# Patient Record
Sex: Female | Born: 1950 | Race: White | Hispanic: No | Marital: Married | Smoking: Former smoker
Health system: Southern US, Community
[De-identification: ages and names within clinical notes are randomized; demographics above are authoritative.]

## PROBLEM LIST (undated history)

## (undated) DIAGNOSIS — N390 Urinary tract infection, site not specified: Secondary | ICD-10-CM

## (undated) DIAGNOSIS — R519 Headache, unspecified: Secondary | ICD-10-CM

## (undated) DIAGNOSIS — R51 Headache: Secondary | ICD-10-CM

## (undated) DIAGNOSIS — Z87442 Personal history of urinary calculi: Secondary | ICD-10-CM

## (undated) DIAGNOSIS — R42 Dizziness and giddiness: Secondary | ICD-10-CM

## (undated) DIAGNOSIS — R202 Paresthesia of skin: Secondary | ICD-10-CM

## (undated) DIAGNOSIS — R200 Anesthesia of skin: Secondary | ICD-10-CM

## (undated) HISTORY — DX: Urinary tract infection, site not specified: N39.0

## (undated) HISTORY — PX: TONSILLECTOMY: SUR1361

## (undated) HISTORY — PX: CATARACT EXTRACTION, BILATERAL: SHX1313

## (undated) HISTORY — PX: CHOLECYSTECTOMY: SHX55

## (undated) HISTORY — PX: ABDOMINAL HYSTERECTOMY: SHX81

---

## 2011-10-30 ENCOUNTER — Emergency Department: Payer: Self-pay | Admitting: Emergency Medicine

## 2011-11-15 ENCOUNTER — Ambulatory Visit: Payer: Self-pay | Admitting: Surgery

## 2011-12-13 ENCOUNTER — Other Ambulatory Visit: Payer: Self-pay | Admitting: Neurology

## 2011-12-13 DIAGNOSIS — M5410 Radiculopathy, site unspecified: Secondary | ICD-10-CM

## 2011-12-17 ENCOUNTER — Ambulatory Visit (HOSPITAL_COMMUNITY)
Admission: RE | Admit: 2011-12-17 | Discharge: 2011-12-17 | Disposition: A | Discharge status: Home / Self Care | Payer: Federal, State, Local not specified - PPO | Source: Ambulatory Visit | Attending: Neurology | Admitting: Neurology

## 2011-12-17 DIAGNOSIS — M5126 Other intervertebral disc displacement, lumbar region: Secondary | ICD-10-CM | POA: Insufficient documentation

## 2011-12-17 DIAGNOSIS — M5410 Radiculopathy, site unspecified: Secondary | ICD-10-CM

## 2011-12-17 DIAGNOSIS — M545 Low back pain, unspecified: Secondary | ICD-10-CM | POA: Insufficient documentation

## 2011-12-17 DIAGNOSIS — R209 Unspecified disturbances of skin sensation: Secondary | ICD-10-CM | POA: Insufficient documentation

## 2012-08-30 ENCOUNTER — Ambulatory Visit: Payer: Self-pay | Admitting: Family Medicine

## 2012-09-25 ENCOUNTER — Ambulatory Visit: Payer: Self-pay | Admitting: Family Medicine

## 2016-07-14 ENCOUNTER — Other Ambulatory Visit: Payer: Self-pay | Admitting: Advanced Practice Midwife

## 2016-07-14 DIAGNOSIS — Z1231 Encounter for screening mammogram for malignant neoplasm of breast: Secondary | ICD-10-CM

## 2016-07-27 ENCOUNTER — Ambulatory Visit
Admission: RE | Admit: 2016-07-27 | Discharge: 2016-07-27 | Disposition: A | Discharge status: Home / Self Care | Payer: Federal, State, Local not specified - PPO | Source: Ambulatory Visit | Attending: Advanced Practice Midwife | Admitting: Advanced Practice Midwife

## 2016-07-27 ENCOUNTER — Other Ambulatory Visit: Payer: Self-pay | Admitting: Advanced Practice Midwife

## 2016-07-27 DIAGNOSIS — Z1231 Encounter for screening mammogram for malignant neoplasm of breast: Secondary | ICD-10-CM | POA: Diagnosis not present

## 2017-07-15 ENCOUNTER — Other Ambulatory Visit: Payer: Self-pay | Admitting: Orthopedic Surgery

## 2017-07-15 DIAGNOSIS — M25561 Pain in right knee: Secondary | ICD-10-CM

## 2017-07-15 DIAGNOSIS — M2391 Unspecified internal derangement of right knee: Secondary | ICD-10-CM

## 2017-07-15 DIAGNOSIS — M2351 Chronic instability of knee, right knee: Secondary | ICD-10-CM

## 2017-07-22 ENCOUNTER — Ambulatory Visit
Admission: RE | Admit: 2017-07-22 | Discharge: 2017-07-22 | Disposition: A | Discharge status: Home / Self Care | Payer: Federal, State, Local not specified - PPO | Source: Ambulatory Visit | Attending: Orthopedic Surgery | Admitting: Orthopedic Surgery

## 2017-07-22 DIAGNOSIS — M2391 Unspecified internal derangement of right knee: Secondary | ICD-10-CM | POA: Diagnosis present

## 2017-07-22 DIAGNOSIS — M23221 Derangement of posterior horn of medial meniscus due to old tear or injury, right knee: Secondary | ICD-10-CM | POA: Diagnosis not present

## 2017-07-22 DIAGNOSIS — M1711 Unilateral primary osteoarthritis, right knee: Secondary | ICD-10-CM | POA: Insufficient documentation

## 2017-07-22 DIAGNOSIS — M25561 Pain in right knee: Secondary | ICD-10-CM

## 2017-07-22 DIAGNOSIS — M2351 Chronic instability of knee, right knee: Secondary | ICD-10-CM

## 2017-08-17 ENCOUNTER — Encounter: Payer: Self-pay | Admitting: *Deleted

## 2017-08-23 ENCOUNTER — Ambulatory Visit: Payer: Federal, State, Local not specified - PPO | Admitting: Anesthesiology

## 2017-08-23 ENCOUNTER — Ambulatory Visit
Admission: RE | Admit: 2017-08-23 | Discharge: 2017-08-23 | Disposition: A | Discharge status: Home / Self Care | Payer: Federal, State, Local not specified - PPO | Source: Ambulatory Visit | Attending: Orthopedic Surgery | Admitting: Orthopedic Surgery

## 2017-08-23 ENCOUNTER — Encounter
Admission: RE | Disposition: A | Discharge status: Home / Self Care | Payer: Self-pay | Source: Ambulatory Visit | Attending: Orthopedic Surgery

## 2017-08-23 DIAGNOSIS — S83231A Complex tear of medial meniscus, current injury, right knee, initial encounter: Secondary | ICD-10-CM | POA: Insufficient documentation

## 2017-08-23 DIAGNOSIS — Z79899 Other long term (current) drug therapy: Secondary | ICD-10-CM | POA: Insufficient documentation

## 2017-08-23 DIAGNOSIS — Z87891 Personal history of nicotine dependence: Secondary | ICD-10-CM | POA: Insufficient documentation

## 2017-08-23 DIAGNOSIS — M25561 Pain in right knee: Secondary | ICD-10-CM | POA: Diagnosis present

## 2017-08-23 DIAGNOSIS — X58XXXA Exposure to other specified factors, initial encounter: Secondary | ICD-10-CM | POA: Diagnosis not present

## 2017-08-23 HISTORY — DX: Paresthesia of skin: R20.2

## 2017-08-23 HISTORY — DX: Headache: R51

## 2017-08-23 HISTORY — DX: Anesthesia of skin: R20.0

## 2017-08-23 HISTORY — DX: Dizziness and giddiness: R42

## 2017-08-23 HISTORY — DX: Headache, unspecified: R51.9

## 2017-08-23 HISTORY — PX: KNEE ARTHROSCOPY WITH MENISCAL REPAIR: SHX5653

## 2017-08-23 SURGERY — ARTHROSCOPY, KNEE, WITH MENISCUS REPAIR
Anesthesia: General | Laterality: Right

## 2017-08-23 MED ORDER — LIDOCAINE HCL 1 % IJ SOLN
INTRAMUSCULAR | Status: DC | PRN
  Administered 2017-08-23: 11 mL via INTRAMUSCULAR
  Administered 2017-08-23: 9 mL via INTRAMUSCULAR

## 2017-08-23 MED ORDER — ASPIRIN EC 325 MG PO TBEC: 325.0000 mg | DELAYED_RELEASE_TABLET | Freq: Every day | ORAL | 0 refills | Status: AC

## 2017-08-23 MED ORDER — KETOROLAC TROMETHAMINE 30 MG/ML IJ SOLN
INTRAMUSCULAR | Status: DC | PRN
  Administered 2017-08-23: 30 mg via INTRAVENOUS

## 2017-08-23 MED ORDER — DEXAMETHASONE SODIUM PHOSPHATE 4 MG/ML IJ SOLN
INTRAMUSCULAR | Status: DC | PRN
  Administered 2017-08-23: 8 mg via INTRAVENOUS

## 2017-08-23 MED ORDER — MEPERIDINE HCL 25 MG/ML IJ SOLN: 6.2500 mg | INTRAMUSCULAR | Status: DC | PRN

## 2017-08-23 MED ORDER — ONDANSETRON 4 MG PO TBDP: 4.0000 mg | ORAL_TABLET | Freq: Three times a day (TID) | ORAL | 0 refills | Status: DC | PRN

## 2017-08-23 MED ORDER — OXYCODONE HCL 5 MG PO TABS: 5.0000 mg | ORAL_TABLET | Freq: Once | ORAL | Status: DC | PRN

## 2017-08-23 MED ORDER — FENTANYL CITRATE (PF) 100 MCG/2ML IJ SOLN
INTRAMUSCULAR | Status: DC | PRN
  Administered 2017-08-23 (×2): 50 ug via INTRAVENOUS

## 2017-08-23 MED ORDER — OXYCODONE HCL 5 MG/5ML PO SOLN: 5.0000 mg | Freq: Once | ORAL | Status: DC | PRN

## 2017-08-23 MED ORDER — CEFAZOLIN SODIUM-DEXTROSE 2-4 GM/100ML-% IV SOLN
2.0000 g | Freq: Once | INTRAVENOUS | Status: AC
  Administered 2017-08-23: 2 g via INTRAVENOUS

## 2017-08-23 MED ORDER — ONDANSETRON HCL 4 MG/2ML IJ SOLN
INTRAMUSCULAR | Status: DC | PRN
  Administered 2017-08-23: 4 mg via INTRAVENOUS

## 2017-08-23 MED ORDER — LIDOCAINE HCL (CARDIAC) 20 MG/ML IV SOLN
INTRAVENOUS | Status: DC | PRN
  Administered 2017-08-23: 50 mg via INTRATRACHEAL

## 2017-08-23 MED ORDER — FENTANYL CITRATE (PF) 100 MCG/2ML IJ SOLN: 25.0000 ug | INTRAMUSCULAR | Status: DC | PRN

## 2017-08-23 MED ORDER — PROMETHAZINE HCL 25 MG/ML IJ SOLN: 6.2500 mg | INTRAMUSCULAR | Status: DC | PRN

## 2017-08-23 MED ORDER — HYDROCODONE-ACETAMINOPHEN 5-325 MG PO TABS: 1.0000 | ORAL_TABLET | ORAL | 0 refills | Status: DC | PRN

## 2017-08-23 MED ORDER — ACETAMINOPHEN 10 MG/ML IV SOLN
1000.0000 mg | Freq: Once | INTRAVENOUS | Status: AC
  Administered 2017-08-23: 1000 mg via INTRAVENOUS

## 2017-08-23 MED ORDER — LACTATED RINGERS IV SOLN
10.0000 mL/h | INTRAVENOUS | Status: DC
  Administered 2017-08-23: 10 mL/h via INTRAVENOUS

## 2017-08-23 MED ORDER — PROPOFOL 10 MG/ML IV BOLUS
INTRAVENOUS | Status: DC | PRN
  Administered 2017-08-23: 200 mg via INTRAVENOUS

## 2017-08-23 MED ORDER — MIDAZOLAM HCL 5 MG/5ML IJ SOLN
INTRAMUSCULAR | Status: DC | PRN
  Administered 2017-08-23: 2 mg via INTRAVENOUS

## 2017-08-23 SURGICAL SUPPLY — 29 items
BUR RADIUS 3.5 (BURR) ×3 IMPLANT
CHLORAPREP W/TINT 26ML (MISCELLANEOUS) ×3 IMPLANT
COOLER POLAR GLACIER W/PUMP (MISCELLANEOUS) ×3 IMPLANT
COVER LIGHT HANDLE UNIVERSAL (MISCELLANEOUS) ×6 IMPLANT
CUFF TOURN SGL QUICK 30 (MISCELLANEOUS) ×2
CUFF TRNQT CYL LO 30X4X (MISCELLANEOUS) ×1 IMPLANT
DRAPE IMP U-DRAPE 54X76 (DRAPES) ×3 IMPLANT
GAUZE SPONGE 4X4 12PLY STRL (GAUZE/BANDAGES/DRESSINGS) ×3 IMPLANT
GLOVE BIO SURGEON STRL SZ8 (GLOVE) ×6 IMPLANT
GLOVE INDICATOR 8.0 STRL GRN (GLOVE) ×3 IMPLANT
GOWN STRL REUS W/ TWL LRG LVL3 (GOWN DISPOSABLE) ×1 IMPLANT
GOWN STRL REUS W/ TWL XL LVL3 (GOWN DISPOSABLE) ×1 IMPLANT
GOWN STRL REUS W/TWL LRG LVL3 (GOWN DISPOSABLE) ×2
GOWN STRL REUS W/TWL XL LVL3 (GOWN DISPOSABLE) ×2
IV LACTATED RINGER IRRG 3000ML (IV SOLUTION) ×4
IV LR IRRIG 3000ML ARTHROMATIC (IV SOLUTION) ×2 IMPLANT
KIT ROOM TURNOVER OR (KITS) ×3 IMPLANT
MANIFOLD 4PT FOR NEPTUNE1 (MISCELLANEOUS) ×3 IMPLANT
NEEDLE HYPO 21X1.5 SAFETY (NEEDLE) ×6 IMPLANT
PACK ARTHROSCOPY KNEE (MISCELLANEOUS) ×3 IMPLANT
PAD WRAPON POLAR KNEE (MISCELLANEOUS) ×1 IMPLANT
SET TUBE SUCT SHAVER OUTFL 24K (TUBING) ×3 IMPLANT
STRAP BODY AND KNEE 60X3 (MISCELLANEOUS) ×3 IMPLANT
SUT ETHILON 3-0 FS-10 30 BLK (SUTURE) ×3
SUTURE EHLN 3-0 FS-10 30 BLK (SUTURE) ×1 IMPLANT
SYR 50ML LL SCALE MARK (SYRINGE) ×3 IMPLANT
TUBING ARTHRO INFLOW-ONLY STRL (TUBING) ×3 IMPLANT
WAND HAND CNTRL MULTIVAC 90 (MISCELLANEOUS) ×3 IMPLANT
WRAPON POLAR PAD KNEE (MISCELLANEOUS) ×3

## 2017-08-23 NOTE — Transfer of Care (Signed)
Immediate Anesthesia Transfer of Care Note  Patient: Michele Todd  Procedure(s) Performed: Procedure(s): KNEE ARTHROSCOPY PARTIAL MEDIAL AND LATERNAL MENISCECTOMY WITH POSSIBLE CHONDROPLASTY /DEBRIDEMENT OF PATELLAFEMORAL COMPARTMENT (Right)  Patient Location: PACU  Anesthesia Type: General LMA  Level of Consciousness: awake, alert  and patient cooperative  Airway and Oxygen Therapy: Patient Spontanous Breathing and Patient connected to supplemental oxygen  Post-op Assessment: Post-op Vital signs reviewed, Patient's Cardiovascular Status Stable, Respiratory Function Stable, Patent Airway and No signs of Nausea or vomiting  Post-op Vital Signs: Reviewed and stable  Complications: No apparent anesthesia complications

## 2017-08-23 NOTE — Op Note (Signed)
Operative Note   SURGERY DATE: 08/23/2017  PRE-OP DIAGNOSIS:  1. Right medial meniscus tear   POST-OP DIAGNOSIS:  1. Right medial meniscus tear  PROCEDURES:  1. Right knee arthroscopy, partial medial meniscectomy 2. Chondroplasty of patellofemoral, lateral, and medial compartments  SURGEON: Rosealee Albee, MD  ANESTHESIA: Gen  ESTIMATED BLOOD LOSS: minimal  TOTAL IV FLUIDS: per anesthesia  INDICATION(S): Michele Todd is a 66 y.o. female who initially injured her knee on 05/28/2017 while she was in [place]. The injury occurred when she was stepping off a bus and felt sudden right knee pain. She feels sensations of catching, especially with twisting motions of her knee and pain is located along the medial joint line. An MRI showed a medial meniscus tear.  OPERATIVE FINDINGS:   Examination under anesthesia: A careful examination under anesthesia was performed.  Passive range of motion was: Hyperextension: 2.  Extension: 0.  Flexion: 120.  Lachman: normal. Pivot Shift: normal.  Posterior drawer: normal.  Varus stability in full extension: normal.  Varus stability in 30 degrees of flexion: normal.  Valgus stability in full extension: normal.  Valgus stability in 30 degrees of flexion: normal.  Intra-operative findings: A thorough arthroscopic examination of the knee was performed.  The findings are: 1. Suprapatellar pouch: Normal 2. Undersurface of median ridge: Normal 3. Medial patellar facet: Grade 2 degenerative changes 4. Lateral patellar facet: Grade 2 degenerative changes 5. Trochlea: Grade 1 degenerative changes 6. Lateral gutter/popliteus tendon: Normal 7. Hoffa's fat pad: Inflamed 8. Medial gutter/plica: Normal 9. ACL: Normal 10. PCL: Normal 11. Medial meniscus: Complex tear with both radial and horizontal components of the posterior horn. There was also a horizontal undersurface tear of the anterior horn 12. Medial compartment cartilage: Grade 2  degenerative changes 13. Lateral meniscus: Fissuring of anterior horn in line with meniscus fibers, but no gross tearing 14. Lateral compartment cartilage: Normal  OPERATIVE REPORT:    I identified Michele Todd in the pre-operative holding area.  I marked the right knee with my initials. I reviewed the risks and benefits of the proposed surgical intervention and the patient (and/or patient's guardian) wished to proceed. The patient was transferred to the operative suite and placed in the supine position with all bony prominences padded.  Anesthesia was administered. Appropriate IV antibiotics were administered within 30 minutes before incision. The extremity was then prepped and draped in standard fashion. A time out was performed confirming the correct extremity, correct patient, and correct procedure.  Arthroscopy portals were marked. Local anesthetic was injected to the planned portal sites. The anterolateral portal was established with an 11 blade followed by a superolateral portal to provide for outflow of the joint.     The arthroscope was placed in the anterolateral portal and then into the suprapatellar pouch.  A diagnostic knee scope was completed with the above findings. The medial meniscus tear was identified.  Next the medial portal was established under needle localization. The meniscal tear was debrided using an arthroscopic biter and an oscillating shaver until the meniscus had stable borders. The hypertrophic and synovitic fat pat was debrided. A chondroplasty was performed of the medial compartment, lateral compartment, and patellofemoral compartment such that there were stable cartilage edges without any loose fragments of cartilage. Arthroscopic fluid was removed from the joint.  The portals were closed with 3-0 Nylon suture. Sterile dressings included Xeroform, 4x4s, Sof-Rol, and Bias wrap. A Polarcare was placed.  The patient was then awakened and taken to  the PACU  hemodynamically stable without complication.   POSTOPERATIVE PLAN: The patient will be discharged home today once they meet PACU criteria. Aspirin 325 mg daily was prescribed for 2 weeks for DVT prophylaxis. Physical therapy will start on POD#3-4.Weight-bearing as tolerated. They will follow up in 2 weeks per protocol.

## 2017-08-23 NOTE — Anesthesia Postprocedure Evaluation (Signed)
Anesthesia Post Note  Patient: Michele Todd  Procedure(s) Performed: Procedure(s) (LRB): KNEE ARTHROSCOPY PARTIAL MEDIAL AND LATERNAL MENISCECTOMY WITH POSSIBLE CHONDROPLASTY /DEBRIDEMENT OF PATELLAFEMORAL COMPARTMENT (Right)  Patient location during evaluation: PACU Anesthesia Type: General Level of consciousness: awake and alert Pain management: pain level controlled Vital Signs Assessment: post-procedure vital signs reviewed and stable Respiratory status: spontaneous breathing, nonlabored ventilation, respiratory function stable and patient connected to nasal cannula oxygen Cardiovascular status: blood pressure returned to baseline and stable Postop Assessment: no apparent nausea or vomiting Anesthetic complications: no    Lavina Resor ELAINE

## 2017-08-23 NOTE — Discharge Instructions (Signed)
Arthroscopic Knee Surgery - Partial Meniscectomy  Post-Op Instructions  1. Bracing or crutches: Crutches will be provided at the time of discharge from the surgery center if you do not already have them.  2. Ice: You may be provided with a device [facility]) that allows you to ice the affected area effectively. Otherwise you can ice manually.   3. Driving:  Plan on not driving for at least two weeks. Please note that you are advised NOT to drive while taking narcotic pain medications as you may be impaired and unsafe to drive.  4. Activity: Ankle pumps several times an hour while awake to prevent blood clots. Weight bearing: as tolerated. Use crutches for as needed (usually ~1 week or less) until pain allows you to ambulate without a limp. Bending and straightening the knee is unlimited. Elevate knee above heart level as much as possible for one week. Avoid standing more than 5 minutes (consecutively) for the first week.  Avoid long distance travel for 2 weeks.  5. Medications:  - You have been provided a prescription for narcotic pain medicine. After surgery, take 1-2 narcotic tablets every 4 hours if needed for severe pain.  - A prescription for anti-nausea medication will be provided in case the narcotic medicine causes nausea - take 1 tablet every 6 hours only if nauseated.  - Take ibuprofen 600 mg every 6 hours with food to reduce post-operative knee swelling. DO NOT STOP IBUPROFEN POST-OP UNTIL INSTRUCTED TO DO SO at first post-op office visit (10-14 days after surgery).  - Take enteric coated aspirin 325 mg once daily for 2 weeks to prevent blood clots.    6. Bandages: The physical therapist should change the bandages at the first post-op appointment. If needed, the dressing supplies have been provided to you.  7. Physical Therapy: 1-2 times per week for 6 weeks. Therapy typically starts on post operative Day 3 or 4. You have been provided an order for physical therapy. The  therapist will provide home exercises.  8. Work: May return to full work when off of crutches. May do light duty/desk job in approximately 1-2 weeks when off of narcotics, pain is well-controlled, and swelling has decreased.  9. Post-Op Appointments: Your first post-op appointment will be with Dr. Allena Katz in approximately 2 weeks time.   If you find that they have not been scheduled please call the Orthopaedic Appointment front desk at [phone].    General Anesthesia, Adult, Care After These instructions provide you with information about caring for yourself after your procedure. Your health care provider may also give you more specific instructions. Your treatment has been planned according to current medical practices, but problems sometimes occur. Call your health care provider if you have any problems or questions after your procedure. What can I expect after the procedure? After the procedure, it is common to have:  Vomiting.  A sore throat.  Mental slowness.  It is common to feel:  Nauseous.  Cold or shivery.  Sleepy.  Tired.  Sore or achy, even in parts of your body where you did not have surgery.  Follow these instructions at home: For at least 24 hours after the procedure:  Do not: ? Participate in activities where you could fall or become injured. ? Drive. ? Use heavy machinery. ? Drink alcohol. ? Take sleeping pills or medicines that cause drowsiness. ? Make important decisions or sign legal documents. ? Take care of children on your own.  Rest. Eating and drinking  If you vomit, drink water, juice, or soup when you can drink without vomiting.  Drink enough fluid to keep your urine clear or pale yellow.  Make sure you have little or no nausea before eating solid foods.  Follow the diet recommended by your health care provider. General instructions  Have a responsible adult stay with you until you are awake and alert.  Return to your normal  activities as told by your health care provider. Ask your health care provider what activities are safe for you.  Take over-the-counter and prescription medicines only as told by your health care provider.  If you smoke, do not smoke without supervision.  Keep all follow-up visits as told by your health care provider. This is important. Contact a health care provider if:  You continue to have nausea or vomiting at home, and medicines are not helpful.  You cannot drink fluids or start eating again.  You cannot urinate after 8-12 hours.  You develop a skin rash.  You have fever.  You have increasing redness at the site of your procedure. Get help right away if:  You have difficulty breathing.  You have chest pain.  You have unexpected bleeding.  You feel that you are having a life-threatening or urgent problem. This information is not intended to replace advice given to you by your health care provider. Make sure you discuss any questions you have with your health care provider. Document Released: 02/28/2001 Document Revised: 04/26/2016 Document Reviewed: 11/06/2015 Elsevier Interactive Patient Education  [employer].

## 2017-08-23 NOTE — H&P (Signed)
Paper H&P to be scanned into permanent record. H&P reviewed. No significant changes noted.  

## 2017-08-23 NOTE — Anesthesia Procedure Notes (Signed)
Procedure Name: LMA Insertion Date/Time: 08/23/2017 1:23 PM Performed by: Andee Poles Pre-anesthesia Checklist: Patient identified, Emergency Drugs available, Suction available, Timeout performed and Patient being monitored Patient Re-evaluated:Patient Re-evaluated prior to induction Oxygen Delivery Method: Circle system utilized Preoxygenation: Pre-oxygenation with 100% oxygen Induction Type: IV induction LMA: LMA inserted LMA Size: 4.0 Number of attempts: 1 Placement Confirmation: positive ETCO2 and breath sounds checked- equal and bilateral Tube secured with: Tape

## 2017-08-23 NOTE — Anesthesia Preprocedure Evaluation (Signed)
Anesthesia Evaluation  Patient identified by MRN, date of birth, ID band Patient awake    Reviewed: Allergy & Precautions, H&P , NPO status , Patient's Chart, lab work & pertinent test results, reviewed documented beta blocker date and time   Airway Mallampati: II  TM Distance: >3 FB Neck ROM: full    Dental no notable dental hx.    Pulmonary neg pulmonary ROS, former smoker,    Pulmonary exam normal breath sounds clear to auscultation       Cardiovascular Exercise Tolerance: Good negative cardio ROS   Rhythm:regular Rate:Normal     Neuro/Psych  Headaches, negative psych ROS   GI/Hepatic negative GI ROS, Neg liver ROS,   Endo/Other  negative endocrine ROS  Renal/GU negative Renal ROS  negative genitourinary   Musculoskeletal   Abdominal   Peds  Hematology negative hematology ROS (+)   Anesthesia Other Findings   Reproductive/Obstetrics negative OB ROS                             Anesthesia Physical Anesthesia Plan  ASA: II  Anesthesia Plan: General LMA   Post-op Pain Management:    Induction:   PONV Risk Score and Plan:   Airway Management Planned:   Additional Equipment:   Intra-op Plan:   Post-operative Plan:   Informed Consent: I have reviewed the patients History and Physical, chart, labs and discussed the procedure including the risks, benefits and alternatives for the proposed anesthesia with the patient or authorized representative who has indicated his/her understanding and acceptance.   Dental Advisory Given  Plan Discussed with: CRNA  Anesthesia Plan Comments:         Anesthesia Quick Evaluation

## 2017-08-24 ENCOUNTER — Encounter: Payer: Self-pay | Admitting: Orthopedic Surgery

## 2017-11-11 ENCOUNTER — Encounter: Payer: Self-pay | Admitting: Advanced Practice Midwife

## 2017-11-11 ENCOUNTER — Other Ambulatory Visit: Payer: Self-pay

## 2017-11-11 ENCOUNTER — Ambulatory Visit (INDEPENDENT_AMBULATORY_CARE_PROVIDER_SITE_OTHER): Payer: Federal, State, Local not specified - PPO | Admitting: Advanced Practice Midwife

## 2017-11-11 VITALS — BP 140/82 | HR 79 | Ht 68.0 in | Wt 205.0 lb

## 2017-11-11 DIAGNOSIS — Z01419 Encounter for gynecological examination (general) (routine) without abnormal findings: Secondary | ICD-10-CM

## 2017-11-11 DIAGNOSIS — Z0000 Encounter for general adult medical examination without abnormal findings: Secondary | ICD-10-CM

## 2017-11-11 NOTE — Patient Instructions (Signed)
Health Maintenance for Postmenopausal Women Menopause is a normal process in which your reproductive ability comes to an end. This process happens gradually over a span of months to years, usually between the ages of 22 and 9. Menopause is complete when you have missed 12 consecutive menstrual periods. It is important to talk with your health care provider about some of the most common conditions that affect postmenopausal women, such as heart disease, cancer, and bone loss (osteoporosis). Adopting a healthy lifestyle and getting preventive care can help to promote your health and wellness. Those actions can also lower your chances of developing some of these common conditions. What should I know about menopause? During menopause, you may experience a number of symptoms, such as:  Moderate-to-severe hot flashes.  Night sweats.  Decrease in sex drive.  Mood swings.  Headaches.  Tiredness.  Irritability.  Memory problems.  Insomnia.  Choosing to treat or not to treat menopausal changes is an individual decision that you make with your health care provider. What should I know about hormone replacement therapy and supplements? Hormone therapy products are effective for treating symptoms that are associated with menopause, such as hot flashes and night sweats. Hormone replacement carries certain risks, especially as you become older. If you are thinking about using estrogen or estrogen with progestin treatments, discuss the benefits and risks with your health care provider. What should I know about heart disease and stroke? Heart disease, heart attack, and stroke become more likely as you age. This may be due, in part, to the hormonal changes that your body experiences during menopause. These can affect how your body processes dietary fats, triglycerides, and cholesterol. Heart attack and stroke are both medical emergencies. There are many things that you can do to help prevent heart disease  and stroke:  Have your blood pressure checked at least every 1-2 years. High blood pressure causes heart disease and increases the risk of stroke.  If you are 53-22 years old, ask your health care provider if you should take aspirin to prevent a heart attack or a stroke.  Do not use any tobacco products, including cigarettes, chewing tobacco, or electronic cigarettes. If you need help quitting, ask your health care provider.  It is important to eat a healthy diet and maintain a healthy weight. ? Be sure to include plenty of vegetables, fruits, low-fat dairy products, and lean protein. ? Avoid eating foods that are high in solid fats, added sugars, or salt (sodium).  Get regular exercise. This is one of the most important things that you can do for your health. ? Try to exercise for at least 150 minutes each week. The type of exercise that you do should increase your heart rate and make you sweat. This is known as moderate-intensity exercise. ? Try to do strengthening exercises at least twice each week. Do these in addition to the moderate-intensity exercise.  Know your numbers.Ask your health care provider to check your cholesterol and your blood glucose. Continue to have your blood tested as directed by your health care provider.  What should I know about cancer screening? There are several types of cancer. Take the following steps to reduce your risk and to catch any cancer development as early as possible. Breast Cancer  Practice breast self-awareness. ? This means understanding how your breasts normally appear and feel. ? It also means doing regular breast self-exams. Let your health care provider know about any changes, no matter how small.  If you are 40  or older, have a clinician do a breast exam (clinical breast exam or CBE) every year. Depending on your age, family history, and medical history, it may be recommended that you also have a yearly breast X-ray (mammogram).  If you  have a family history of breast cancer, talk with your health care provider about genetic screening.  If you are at high risk for breast cancer, talk with your health care provider about having an MRI and a mammogram every year.  Breast cancer (BRCA) gene test is recommended for women who have family members with BRCA-related cancers. Results of the assessment will determine the need for genetic counseling and BRCA1 and for BRCA2 testing. BRCA-related cancers include these types: ? Breast. This occurs in males or females. ? Ovarian. ? Tubal. This may also be called fallopian tube cancer. ? Cancer of the abdominal or pelvic lining (peritoneal cancer). ? Prostate. ? Pancreatic.  Cervical, Uterine, and Ovarian Cancer Your health care provider may recommend that you be screened regularly for cancer of the pelvic organs. These include your ovaries, uterus, and vagina. This screening involves a pelvic exam, which includes checking for microscopic changes to the surface of your cervix (Pap test).  For women ages 21-65, health care providers may recommend a pelvic exam and a Pap test every three years. For women ages 79-65, they may recommend the Pap test and pelvic exam, combined with testing for human papilloma virus (HPV), every five years. Some types of HPV increase your risk of cervical cancer. Testing for HPV may also be done on women of any age who have unclear Pap test results.  Other health care providers may not recommend any screening for nonpregnant women who are considered low risk for pelvic cancer and have no symptoms. Ask your health care provider if a screening pelvic exam is right for you.  If you have had past treatment for cervical cancer or a condition that could lead to cancer, you need Pap tests and screening for cancer for at least 20 years after your treatment. If Pap tests have been discontinued for you, your risk factors (such as having a new sexual partner) need to be  reassessed to determine if you should start having screenings again. Some women have medical problems that increase the chance of getting cervical cancer. In these cases, your health care provider may recommend that you have screening and Pap tests more often.  If you have a family history of uterine cancer or ovarian cancer, talk with your health care provider about genetic screening.  If you have vaginal bleeding after reaching menopause, tell your health care provider.  There are currently no reliable tests available to screen for ovarian cancer.  Lung Cancer Lung cancer screening is recommended for adults 69-62 years old who are at high risk for lung cancer because of a history of smoking. A yearly low-dose CT scan of the lungs is recommended if you:  Currently smoke.  Have a history of at least 30 pack-years of smoking and you currently smoke or have quit within the past 15 years. A pack-year is smoking an average of one pack of cigarettes per day for one year.  Yearly screening should:  Continue until it has been 15 years since you quit.  Stop if you develop a health problem that would prevent you from having lung cancer treatment.  Colorectal Cancer  This type of cancer can be detected and can often be prevented.  Routine colorectal cancer screening usually begins at  age 42 and continues through age 45.  If you have risk factors for colon cancer, your health care provider may recommend that you be screened at an earlier age.  If you have a family history of colorectal cancer, talk with your health care provider about genetic screening.  Your health care provider may also recommend using home test kits to check for hidden blood in your stool.  A small camera at the end of a tube can be used to examine your colon directly (sigmoidoscopy or colonoscopy). This is done to check for the earliest forms of colorectal cancer.  Direct examination of the colon should be repeated every  5-10 years until age 71. However, if early forms of precancerous polyps or small growths are found or if you have a family history or genetic risk for colorectal cancer, you may need to be screened more often.  Skin Cancer  Check your skin from head to toe regularly.  Monitor any moles. Be sure to tell your health care provider: ? About any new moles or changes in moles, especially if there is a change in a mole's shape or color. ? If you have a mole that is larger than the size of a pencil eraser.  If any of your family members has a history of skin cancer, especially at a young age, talk with your health care provider about genetic screening.  Always use sunscreen. Apply sunscreen liberally and repeatedly throughout the day.  Whenever you are outside, protect yourself by wearing long sleeves, pants, a wide-brimmed hat, and sunglasses.  What should I know about osteoporosis? Osteoporosis is a condition in which bone destruction happens more quickly than new bone creation. After menopause, you may be at an increased risk for osteoporosis. To help prevent osteoporosis or the bone fractures that can happen because of osteoporosis, the following is recommended:  If you are 46-71 years old, get at least 1,000 mg of calcium and at least 600 mg of vitamin D per day.  If you are older than age 55 but younger than age 65, get at least 1,200 mg of calcium and at least 600 mg of vitamin D per day.  If you are older than age 54, get at least 1,200 mg of calcium and at least 800 mg of vitamin D per day.  Smoking and excessive alcohol intake increase the risk of osteoporosis. Eat foods that are rich in calcium and vitamin D, and do weight-bearing exercises several times each week as directed by your health care provider. What should I know about how menopause affects my mental health? Depression may occur at any age, but it is more common as you become older. Common symptoms of depression  include:  Low or sad mood.  Changes in sleep patterns.  Changes in appetite or eating patterns.  Feeling an overall lack of motivation or enjoyment of activities that you previously enjoyed.  Frequent crying spells.  Talk with your health care provider if you think that you are experiencing depression. What should I know about immunizations? It is important that you get and maintain your immunizations. These include:  Tetanus, diphtheria, and pertussis (Tdap) booster vaccine.  Influenza every year before the flu season begins.  Pneumonia vaccine.  Shingles vaccine.  Your health care provider may also recommend other immunizations. This information is not intended to replace advice given to you by your health care provider. Make sure you discuss any questions you have with your health care provider. Document Released: 01/14/2006  Document Revised: 06/11/2016 Document Reviewed: 08/26/2015 Elsevier Interactive Patient Education  2018 [employer]  

## 2017-11-11 NOTE — Progress Notes (Signed)
Patient ID: Michele Todd, female   DOB: Mar 26, 1951, 66 y.o.   MRN: [id number]     Gynecology Annual Exam  [facility]  Chief Complaint:  Chief Complaint  Patient presents with  . Gynecologic Exam    Need Mammo order    History of Present Illness:Patient is a 66 y.o. G2P2 presents for annual exam. The patient has no complaints today.   LMP: No LMP recorded. Patient has had a hysterectomy. Menarche:not applicable  Postcoital Bleeding: no Dysmenorrhea: not applicable   The patient is not currently sexually active. She denies dyspareunia.  The patient does perform self breast exams.  There is no notable family history of breast or ovarian cancer in her family.  The patient wears seatbelts: yes.   The patient has regular exercise: yes.    The patient denies current symptoms of depression.     Review of Systems: Review of Systems  Constitutional: Negative.   HENT: Negative.   Eyes: Negative.   Respiratory: Negative.   Cardiovascular: Negative.   Gastrointestinal: Negative.   Genitourinary: Negative.   Musculoskeletal: Negative.   Skin: Negative.   Neurological: Negative.   Endo/Heme/Allergies: Negative.   Psychiatric/Behavioral: Negative.     Past Medical History:  Past Medical History:  Diagnosis Date  . Headache    sinus  . Numbness and tingling of right lower extremity    R/T pinched nerve  . Vertigo    x1, over 15 yrs ago    Past Surgical History:  Past Surgical History:  Procedure Laterality Date  . ABDOMINAL HYSTERECTOMY    . CATARACT EXTRACTION, BILATERAL    . CHOLECYSTECTOMY    . KNEE ARTHROSCOPY WITH MENISCAL REPAIR Right 08/23/2017   Procedure: KNEE ARTHROSCOPY PARTIAL MEDIAL AND LATERNAL MENISCECTOMY WITH POSSIBLE CHONDROPLASTY /DEBRIDEMENT OF PATELLAFEMORAL COMPARTMENT;  Surgeon: Signa KellPatel, Sunny, MD;  [facility];  Service: Orthopedics;  Laterality: Right;  . TONSILLECTOMY      Gynecologic  History:  No LMP recorded. Patient has had a hysterectomy. Last Pap: no record of PAP smear Last mammogram: 2 years ago Results were: BI-RAD I Obstetric History: G2P2  Family History:  Family History  Problem Relation Age of Onset  . Lung cancer Mother   . Breast cancer Paternal Aunt        1040's    Social History:  Social History   Socioeconomic History  . Marital status: Married    Spouse name: Not on file  . Number of children: Not on file  . Years of education: Not on file  . Highest education level: Not on file  Social Needs  . Financial resource strain: Not on file  . Food insecurity - worry: Not on file  . Food insecurity - inability: Not on file  . Transportation needs - medical: Not on file  . Transportation needs - non-medical: Not on file  Occupational History  . Not on file  Tobacco Use  . Smoking status: Former Games developermoker  . Smokeless tobacco: Never Used  . Tobacco comment: social - as teenager  Substance and Sexual Activity  . Alcohol use: Yes    Comment: holidays  . Drug use: No  . Sexual activity: Yes  Other Topics Concern  . Not on file  Social History Narrative  . Not on file    Allergies:  No Known Allergies  Medications: Prior to Admission medications   Medication Sig Start Date End Date Taking? Authorizing Provider  b complex vitamins  tablet Take 1 tablet by mouth daily.   Yes [provider]  Glucosamine-Chondroitin (OSTEO BI-FLEX REGULAR STRENGTH PO) Take by mouth daily.   Yes [provider]  ibuprofen (ADVIL,MOTRIN) 200 MG tablet Take 200 mg by mouth every 6 (six) hours as needed.   Yes [provider]  Multiple Vitamins-Minerals (MULTIVITAMIN ADULTS PO) Take by mouth daily.   Yes [provider]  Phenylephrine-APAP-Guaifenesin 5-325-200 MG TABS Take by mouth 4 (four) times daily as needed.    [provider]    Physical Exam Vitals: Blood pressure 140/82, pulse 79, height 5\' 8"  (1.727 m),  weight 205 lb (93 kg).  General: NAD HEENT: normocephalic, anicteric Thyroid: no enlargement, no palpable nodules Pulmonary: No increased work of breathing, CTAB Cardiovascular: RRR, distal pulses 2+ Breast: Breast symmetrical, no tenderness, no palpable nodules or masses, no skin or nipple retraction present, no nipple discharge.  No axillary or supraclavicular lymphadenopathy. Abdomen: NABS, soft, non-tender, non-distended.  Umbilicus without lesions.  No hepatomegaly, splenomegaly or masses palpable. No evidence of hernia  Genitourinary:  Deferred for no PAP and no concerns Extremities: no edema, erythema, or tenderness Neurologic: Grossly intact Psychiatric: mood appropriate, affect full    Assessment: 66 y.o. G2P2 routine annual exam  Plan: Problem List Items Addressed This Visit    None    Visit Diagnoses    Well woman exam without gynecological exam    -  Primary   Relevant Orders   MM DIGITAL SCREENING BILATERAL      1) Mammogram - recommend yearly screening mammogram.  Mammogram Was ordered today  2) STI screening was offered and declined  3) ASCCP guidelines and rational discussed.  Patient opts for discontinue screening interval  4) Osteoporosis  - per USPTF routine screening DEXA at age 265   Consider FDA-approved medical therapies in postmenopausal women and men aged 64 years and older, based on the following: a) A hip or vertebral (clinical or morphometric) fracture b) T-score ? -2.5 at the femoral neck or spine after appropriate evaluation to exclude secondary causes C) Low bone mass (T-score between -1.0 and -2.5 at the femoral neck or spine) and a 10-year probability of a hip fracture ? 3% or a 10-year probability of a major osteoporosis-related fracture ? 20% based on the [place]-adapted WHO algorithm   5) Routine healthcare maintenance including cholesterol, diabetes screening discussed managed by PCP  6) Colonoscopy Up to date.  Screening recommended  starting at age 64 for average risk individuals, age 64 for individuals deemed at increased risk (including African Americans) and recommended to continue until age 64.  For patient age 64-85 individualized approach is recommended.  Gold standard screening is via colonoscopy, Cologuard screening is an acceptable alternative for patient unwilling or unable to undergo colonoscopy.  "Colorectal cancer screening for average?risk adults: 2018 guideline update from the American Cancer Society"CA: A Cancer Journal for Clinicians: May 04, 2017   7) Follow up 1 year for routine annual  Tresea MallJane Teleah Villamar, [place]

## 2017-12-13 ENCOUNTER — Ambulatory Visit
Admission: RE | Admit: 2017-12-13 | Discharge: 2017-12-13 | Disposition: A | Discharge status: Home / Self Care | Payer: Federal, State, Local not specified - PPO | Source: Ambulatory Visit | Attending: Advanced Practice Midwife | Admitting: Advanced Practice Midwife

## 2017-12-13 DIAGNOSIS — Z0000 Encounter for general adult medical examination without abnormal findings: Secondary | ICD-10-CM

## 2017-12-13 DIAGNOSIS — Z1231 Encounter for screening mammogram for malignant neoplasm of breast: Secondary | ICD-10-CM | POA: Insufficient documentation

## 2018-09-12 ENCOUNTER — Other Ambulatory Visit (HOSPITAL_COMMUNITY): Payer: Self-pay | Admitting: Podiatry

## 2018-09-12 DIAGNOSIS — M7662 Achilles tendinitis, left leg: Secondary | ICD-10-CM

## 2018-09-14 ENCOUNTER — Ambulatory Visit (HOSPITAL_COMMUNITY): Payer: Federal, State, Local not specified - PPO

## 2018-09-14 ENCOUNTER — Ambulatory Visit (HOSPITAL_COMMUNITY)
Admission: RE | Admit: 2018-09-14 | Discharge: 2018-09-14 | Disposition: A | Discharge status: Home / Self Care | Payer: Medicare Other | Source: Ambulatory Visit | Attending: Podiatry | Admitting: Podiatry

## 2018-09-14 DIAGNOSIS — M7662 Achilles tendinitis, left leg: Secondary | ICD-10-CM | POA: Diagnosis not present

## 2018-09-20 ENCOUNTER — Ambulatory Visit (HOSPITAL_COMMUNITY): Payer: Federal, State, Local not specified - PPO

## 2019-06-07 ENCOUNTER — Other Ambulatory Visit: Payer: Self-pay | Admitting: Orthopedic Surgery

## 2019-06-07 DIAGNOSIS — M25562 Pain in left knee: Secondary | ICD-10-CM

## 2019-06-11 ENCOUNTER — Other Ambulatory Visit (HOSPITAL_COMMUNITY): Payer: Self-pay | Admitting: Orthopedic Surgery

## 2019-06-11 DIAGNOSIS — M25562 Pain in left knee: Secondary | ICD-10-CM

## 2019-06-13 ENCOUNTER — Ambulatory Visit (HOSPITAL_COMMUNITY)
Admission: RE | Admit: 2019-06-13 | Discharge: 2019-06-13 | Disposition: A | Discharge status: Home / Self Care | Payer: Medicare Other | Source: Ambulatory Visit | Attending: Orthopedic Surgery | Admitting: Orthopedic Surgery

## 2019-06-13 ENCOUNTER — Other Ambulatory Visit: Payer: Self-pay

## 2019-06-13 DIAGNOSIS — M25562 Pain in left knee: Secondary | ICD-10-CM | POA: Insufficient documentation

## 2019-10-15 ENCOUNTER — Other Ambulatory Visit: Payer: Self-pay | Admitting: Student

## 2019-10-15 DIAGNOSIS — M8580 Other specified disorders of bone density and structure, unspecified site: Secondary | ICD-10-CM | POA: Insufficient documentation

## 2019-10-15 DIAGNOSIS — E782 Mixed hyperlipidemia: Secondary | ICD-10-CM | POA: Insufficient documentation

## 2019-10-15 DIAGNOSIS — Z1231 Encounter for screening mammogram for malignant neoplasm of breast: Secondary | ICD-10-CM

## 2019-10-30 ENCOUNTER — Ambulatory Visit
Admission: RE | Admit: 2019-10-30 | Discharge: 2019-10-30 | Disposition: A | Discharge status: Home / Self Care | Payer: Medicare Other | Source: Ambulatory Visit | Attending: Student | Admitting: Student

## 2019-10-30 DIAGNOSIS — Z1231 Encounter for screening mammogram for malignant neoplasm of breast: Secondary | ICD-10-CM | POA: Insufficient documentation

## 2020-08-04 ENCOUNTER — Encounter: Payer: Self-pay | Admitting: Urology

## 2020-08-04 ENCOUNTER — Ambulatory Visit (INDEPENDENT_AMBULATORY_CARE_PROVIDER_SITE_OTHER): Payer: Medicare Other | Admitting: Urology

## 2020-08-04 ENCOUNTER — Other Ambulatory Visit: Payer: Self-pay

## 2020-08-04 VITALS — BP 131/71 | HR 79 | Ht 67.0 in | Wt 197.4 lb

## 2020-08-04 DIAGNOSIS — R3121 Asymptomatic microscopic hematuria: Secondary | ICD-10-CM | POA: Diagnosis not present

## 2020-08-04 DIAGNOSIS — N390 Urinary tract infection, site not specified: Secondary | ICD-10-CM

## 2020-08-04 LAB — BLADDER SCAN AMB NON-IMAGING

## 2020-08-04 MED ORDER — PREMARIN 0.625 MG/GM VA CREA: TOPICAL_CREAM | VAGINAL | 0 refills | Status: DC

## 2020-08-04 NOTE — Patient Instructions (Signed)
Cystoscopy Cystoscopy is a procedure that is used to help diagnose and sometimes treat conditions that affect the lower urinary tract. The lower urinary tract includes the bladder and the urethra. The urethra is the tube that drains urine from the bladder. Cystoscopy is done using a thin, tube-shaped instrument with a light and camera at the end (cystoscope). The cystoscope may be hard or flexible, depending on the goal of the procedure. The cystoscope is inserted through the urethra, into the bladder. Cystoscopy may be recommended if you have:  Urinary tract infections that keep coming back.  Blood in the urine (hematuria).  An inability to control when you urinate (urinary incontinence) or an overactive bladder.  Unusual cells found in a urine sample.  A blockage in the urethra, such as a urinary stone.  Painful urination.  An abnormality in the bladder found during an intravenous pyelogram (IVP) or CT scan. Cystoscopy may also be done to remove a sample of tissue to be examined under a microscope (biopsy). What are the risks? Generally, this is a safe procedure. However, problems may occur, including:  Infection.  Bleeding.  What happens during the procedure?  1. You will be given one or more of the following: ? A medicine to numb the area (local anesthetic). 2. The area around the opening of your urethra will be cleaned. 3. The cystoscope will be passed through your urethra into your bladder. 4. Germ-free (sterile) fluid will flow through the cystoscope to fill your bladder. The fluid will stretch your bladder so that your health care provider can clearly examine your bladder walls. 5. Your doctor will look at the urethra and bladder. 6. The cystoscope will be removed The procedure may vary among health care providers  What can I expect after the procedure? After the procedure, it is common to have: 1. Some soreness or pain in your abdomen and urethra. 2. Urinary symptoms.  These include: ? Mild pain or burning when you urinate. Pain should stop within a few minutes after you urinate. This may last for up to 1 week. ? A small amount of blood in your urine for several days. ? Feeling like you need to urinate but producing only a small amount of urine. Follow these instructions at home: General instructions  Return to your normal activities as told by your health care provider.   Do not drive for 24 hours if you were given a sedative during your procedure.  Watch for any blood in your urine. If the amount of blood in your urine increases, call your health care provider.  If a tissue sample was removed for testing (biopsy) during your procedure, it is up to you to get your test results. Ask your health care provider, or the department that is doing the test, when your results will be ready.  Drink enough fluid to keep your urine pale yellow.  Keep all follow-up visits as told by your health care provider. This is important. Contact a health care provider if you:  Have pain that gets worse or does not get better with medicine, especially pain when you urinate.  Have trouble urinating.  Have more blood in your urine. Get help right away if you:  Have blood clots in your urine.  Have abdominal pain.  Have a fever or chills.  Are unable to urinate. Summary  Cystoscopy is a procedure that is used to help diagnose and sometimes treat conditions that affect the lower urinary tract.  Cystoscopy is done using   a thin, tube-shaped instrument with a light and camera at the end.  After the procedure, it is common to have some soreness or pain in your abdomen and urethra.  Watch for any blood in your urine. If the amount of blood in your urine increases, call your health care provider.  If you were prescribed an antibiotic medicine, take it as told by your health care provider. Do not stop taking the antibiotic even if you start to feel better. This  information is not intended to replace advice given to you by your health care provider. Make sure you discuss any questions you have with your health care provider. Document Revised: 11/14/2018 Document Reviewed: 11/14/2018 Elsevier Patient Education  2020 Elsevier Inc.   

## 2020-08-04 NOTE — Progress Notes (Signed)
08/04/20 11:08 AM   Brayton Caves Lamb [birth year] [id number]  CC: Recurrent UTIs, foul-smelling urine  HPI: I saw Ms. Edelman, in urology clinic for the above issues.  She is a very healthy 69 year old female who presented to her PCP with urinary frequency in May 2021 and was found to have > 100 K E. coli on urine culture and was treated with antibiotics with resolution of her urinary frequency.  However, she developed very foul-smelling urine after this which has continued.  She is very bothered by the odor of the urine.  Follow-up urine cultures in July and August again showed > 100 K E. coli.  She denies any urinary symptoms of dysuria, pelvic pain, pressure, urinary urgency/frequency, or gross hematuria.  She is a never smoker and denies any carcinogenic exposures.  She denies any prior stone history.  She denies any changes in her diet or other health changes.  She is occasionally had some left-sided back pain but that moved over to the midline and then the right side but is only mild and nature.  Urinalysis today with 6-10 WBCs, 3-10 RBCs, many bacteria, trace leukocytes, nitrite negative.  PMH: Past Medical History:  Diagnosis Date  . Headache    sinus  . Numbness and tingling of right lower extremity    R/T pinched nerve  . Urinary tract infection   . Vertigo    x1, over 15 yrs ago    Surgical History: Past Surgical History:  Procedure Laterality Date  . ABDOMINAL HYSTERECTOMY    . CATARACT EXTRACTION, BILATERAL    . CHOLECYSTECTOMY    . KNEE ARTHROSCOPY WITH MENISCAL REPAIR Right 08/23/2017   Procedure: KNEE ARTHROSCOPY PARTIAL MEDIAL AND LATERNAL MENISCECTOMY WITH POSSIBLE CHONDROPLASTY /DEBRIDEMENT OF PATELLAFEMORAL COMPARTMENT;  Surgeon: Signa Kell, MD;  Location: [facility];  Service: Orthopedics;  Laterality: Right;  . TONSILLECTOMY      Family History: Family History  Problem Relation Age of Onset  . Lung cancer Mother   . Breast cancer Paternal Aunt         40's  . Breast cancer Paternal Aunt        In Late 5's.  . Kidney disease Sister     Social History:  reports that she has quit smoking. She has never used smokeless tobacco. She reports current alcohol use. She reports that she does not use drugs.  Physical Exam: BP 131/71 (BP Location: Left Arm, Patient Position: Sitting, Cuff Size: Large)   Pulse 79   Ht 5\' 7"  (1.702 m)   Wt 197 lb 6.4 oz (89.5 kg)   BMI 30.92 kg/m    Constitutional:  Alert and oriented, No acute distress. Cardiovascular: No clubbing, cyanosis, or edema. Respiratory: Normal respiratory effort, no increased work of breathing. GI: Abdomen is soft, nontender, nondistended, no abdominal masses GU: No CVA tenderness  Laboratory Data: Reviewed, see care everywhere  Pertinent Imaging: None to review  Assessment & Plan:   In summary, she is a healthy 69 year old female with persistent foul-smelling urine and positive urine cultures despite no urinary symptoms.  I discussed at her with length that it is difficult to determine the difference between asymptomatic bacteriuria and true urinary tract infection and her she is not having any significant symptoms aside from odor.  We also reviewed her microscopic hematuria on urinalysis today.  We discussed the evaluation and treatment of patients with recurrent UTIs at length.  We specifically discussed the differences between asymptomatic bacteriuria and true urinary tract infection.  We discussed the AUA definition of recurrent UTI of at least 2 culture proven symptomatic acute cystitis episodes in a 76-month period, or 3 within a 1 year period.  We discussed the importance of culture directed antibiotic treatment, and antibiotic stewardship.  First-line therapy includes nitrofurantoin(5 days), Bactrim(3 days), or fosfomycin(3 g single dose).  Possible etiologies of recurrent infection include periurethral tissue atrophy in postmenopausal woman, constipation, sexual  activity, incomplete emptying, anatomic abnormalities, and even genetic predisposition.  Finally, we discussed the role of perineal hygiene, timed voiding, adequate hydration, topical vaginal estrogen, cranberry prophylaxis, and low-dose antibiotic prophylaxis.  We discussed common possible etiologies of microscopic hematuria including idiopathic, urolithiasis, medical renal disease, and malignancy. We discussed the new asymptomatic microscopic hematuria guidelines and risk categories of low, intermediate, and high risk that are based on age, risk factors like smoking, and degree of microscopic hematuria. We discussed work-up can range from repeat urinalysis, renal ultrasound and cystoscopy, to CT urogram and cystoscopy.  They fall into the high risk category, and I recommended proceeding with CT urogram and cystoscopy.   -Urine sent for culture -Pelvic exam at time of cystoscopy -Trial of premarin cream for foul smelling urine, samples given -Consider 6 months antibiotic prophylaxis if persistently bothersome foul-smelling urine despite premarin cream   Legrand Rams, MD 08/04/2020  [facility] [address], [address] [place], [place] [address] [phone]

## 2020-08-05 LAB — URINALYSIS, COMPLETE
Bilirubin, UA: NEGATIVE
Glucose, UA: NEGATIVE
Ketones, UA: NEGATIVE
Nitrite, UA: NEGATIVE
Protein,UA: NEGATIVE
Specific Gravity, UA: 1.02 (ref 1.005–1.030)
Urobilinogen, Ur: 0.2 mg/dL (ref 0.2–1.0)
pH, UA: 6 (ref 5.0–7.5)

## 2020-08-05 LAB — MICROSCOPIC EXAMINATION

## 2020-08-06 LAB — CULTURE, URINE COMPREHENSIVE

## 2020-08-07 ENCOUNTER — Telehealth: Payer: Self-pay

## 2020-08-07 DIAGNOSIS — B9620 Unspecified Escherichia coli [E. coli] as the cause of diseases classified elsewhere: Secondary | ICD-10-CM

## 2020-08-07 DIAGNOSIS — N390 Urinary tract infection, site not specified: Secondary | ICD-10-CM

## 2020-08-07 MED ORDER — AMOXICILLIN-POT CLAVULANATE 875-125 MG PO TABS: 1.0000 | ORAL_TABLET | Freq: Two times a day (BID) | ORAL | 0 refills | Status: AC

## 2020-08-07 NOTE — Telephone Encounter (Signed)
-----   Message from Sondra Come, MD sent at 08/07/2020  1:50 PM EDT ----- Urine culture positive for e coli, lets do augmentin 875-125 BID x 10 days, thanks. Keep follow up as scheduled  Legrand Rams, MD 08/07/2020

## 2020-08-07 NOTE — Telephone Encounter (Signed)
Called pt no answer. Left detailed message informing pt of the information below. Advised pt to call back for questions or concerns. RX sent in.

## 2020-08-19 ENCOUNTER — Other Ambulatory Visit: Payer: Self-pay

## 2020-08-19 ENCOUNTER — Ambulatory Visit
Admission: RE | Admit: 2020-08-19 | Discharge: 2020-08-19 | Disposition: A | Discharge status: Home / Self Care | Payer: Medicare Other | Source: Ambulatory Visit | Attending: Urology | Admitting: Urology

## 2020-08-19 DIAGNOSIS — R3121 Asymptomatic microscopic hematuria: Secondary | ICD-10-CM | POA: Insufficient documentation

## 2020-08-19 LAB — POCT I-STAT CREATININE: Creatinine, Ser: 0.9 mg/dL (ref 0.44–1.00)

## 2020-08-19 MED ORDER — IOHEXOL 300 MG/ML  SOLN
125.0000 mL | Freq: Once | INTRAMUSCULAR | Status: AC | PRN
  Administered 2020-08-19: 125 mL via INTRAVENOUS

## 2020-08-27 ENCOUNTER — Encounter: Payer: Self-pay | Admitting: Urology

## 2020-08-27 ENCOUNTER — Other Ambulatory Visit: Payer: Self-pay

## 2020-08-27 ENCOUNTER — Ambulatory Visit (INDEPENDENT_AMBULATORY_CARE_PROVIDER_SITE_OTHER): Payer: Medicare Other | Admitting: Urology

## 2020-08-27 VITALS — BP 137/92 | HR 90 | Ht 67.0 in | Wt 202.0 lb

## 2020-08-27 DIAGNOSIS — N200 Calculus of kidney: Secondary | ICD-10-CM

## 2020-08-27 DIAGNOSIS — R3121 Asymptomatic microscopic hematuria: Secondary | ICD-10-CM | POA: Diagnosis not present

## 2020-08-27 NOTE — Progress Notes (Signed)
Cystoscopy Procedure Note:  Indication: Recurrent UTI/microscopic hematuria  After informed consent and discussion of the procedure and its risks, Michele Todd was positioned and prepped in the standard fashion.  On exam, the urethra was normal-appearing with no abnormal lesions.  Cystoscopy was performed with a flexible cystoscope. The urethra, bladder neck and entire bladder was visualized in a standard fashion.  The ureteral orifices were visualized in their normal location and orientation.  The bladder mucosa was normal throughout.  Imaging: CT urogram benign aside from a nonobstructing left 5 mm lower pole stone  Findings: Normal cystoscopy  Assessment and Plan: She reports her foul-smelling urine completely resolved after starting Augmentin, as well as with using Premarin cream.  She continues to take cranberry tablets for prophylaxis.  She denies any urinary complaints today.  Continue prophylaxis with cranberry tablets and Premarin cream RTC 1 year with KUB for surveillance of small left nonobstructing stone  Legrand Rams, MD 08/27/2020

## 2020-08-29 LAB — MICROSCOPIC EXAMINATION: Bacteria, UA: NONE SEEN

## 2020-08-29 LAB — URINALYSIS, COMPLETE
Bilirubin, UA: NEGATIVE
Glucose, UA: NEGATIVE
Ketones, UA: NEGATIVE
Leukocytes,UA: NEGATIVE
Nitrite, UA: NEGATIVE
Protein,UA: NEGATIVE
Specific Gravity, UA: 1.025 (ref 1.005–1.030)
Urobilinogen, Ur: 0.2 mg/dL (ref 0.2–1.0)
pH, UA: 5 (ref 5.0–7.5)

## 2020-09-24 ENCOUNTER — Telehealth: Payer: Self-pay | Admitting: Urology

## 2020-09-24 NOTE — Telephone Encounter (Signed)
Patient called the office today insistent that she has a possible UTI.  She claims that the only symptom that she ever has is foul smelling urine.  She is going on vacation to [place] for the rest of the month and is worried about having a UTI.   Her last UTI was in September.  I added the patient to Sam's schedule for tomorrow afternoon.

## 2020-09-25 ENCOUNTER — Encounter: Payer: Self-pay | Admitting: Physician Assistant

## 2020-09-25 ENCOUNTER — Other Ambulatory Visit: Payer: Self-pay

## 2020-09-25 ENCOUNTER — Ambulatory Visit (INDEPENDENT_AMBULATORY_CARE_PROVIDER_SITE_OTHER): Payer: Medicare Other | Admitting: Physician Assistant

## 2020-09-25 VITALS — BP 148/86 | HR 78 | Ht 67.0 in | Wt 200.8 lb

## 2020-09-25 DIAGNOSIS — N390 Urinary tract infection, site not specified: Secondary | ICD-10-CM | POA: Diagnosis not present

## 2020-09-25 DIAGNOSIS — R3915 Urgency of urination: Secondary | ICD-10-CM

## 2020-09-25 LAB — BLADDER SCAN AMB NON-IMAGING: Scan Result: 17 mL

## 2020-09-25 MED ORDER — MIRABEGRON ER 25 MG PO TB24: 25.0000 mg | ORAL_TABLET | Freq: Every day | ORAL | 0 refills | Status: DC

## 2020-09-25 NOTE — Progress Notes (Signed)
09/25/2020 9:10 AM   Michele Todd 09-Jan-1951 [id number]  CC: Chief Complaint  Patient presents with  . Recurrent UTI    HPI: Michele Todd is a 69 y.o. female with PMH microscopic hematuria and recurrent UTI on cranberry tablets and topical vaginal estrogen cream who presents today for evaluation of possible UTI. She was seen most recently by Dr. Richardo Hanks on 08/27/2020 for cystoscopy with no significant findings.  Today she reports a 2-day history of malodorous urine and slightly increased urinary frequency.  She denies dysuria, fever, chills, nausea, and vomiting.  She does not have a history of constipation.  She denies dietary changes or new medications.  She has not done anything to treat her symptoms.  She does report urinary urgency and frequency at baseline without urinary leakage.  She states she has the sudden urge to urinate when she returns home from errands that will require her to run to the bathroom.  She notes worsened urinary urgency when wearing tight fitting clothing and has started wearing loose clothes during travel to avoid this.  She also reports some episodes of double voiding.  In-office UA today positive for trace intact blood; urine microscopy pan negative.  PVR 17 mL.  PMH: Past Medical History:  Diagnosis Date  . Headache    sinus  . Numbness and tingling of right lower extremity    R/T pinched nerve  . Urinary tract infection   . Vertigo    x1, over 15 yrs ago    Surgical History: Past Surgical History:  Procedure Laterality Date  . ABDOMINAL HYSTERECTOMY    . CATARACT EXTRACTION, BILATERAL    . CHOLECYSTECTOMY    . KNEE ARTHROSCOPY WITH MENISCAL REPAIR Right 08/23/2017   Procedure: KNEE ARTHROSCOPY PARTIAL MEDIAL AND LATERNAL MENISCECTOMY WITH POSSIBLE CHONDROPLASTY /DEBRIDEMENT OF PATELLAFEMORAL COMPARTMENT;  Surgeon: Signa Kell, MD;  Location: [facility];  Service: Orthopedics;  Laterality: Right;  . TONSILLECTOMY      Home  Medications:  Allergies as of 09/25/2020   No Known Allergies     Medication List       Accurate as of September 25, 2020  9:10 AM. If you have any questions, ask your nurse or doctor.        b complex vitamins tablet Take 1 tablet by mouth daily.   cetirizine 10 MG tablet Commonly known as: ZYRTEC Take by mouth.   ibuprofen 200 MG tablet Commonly known as: ADVIL Take 200 mg by mouth every 6 (six) hours as needed.   MULTIVITAMIN ADULTS PO Take by mouth daily.   OSTEO BI-FLEX REGULAR STRENGTH PO Take by mouth daily.   Premarin vaginal cream Generic drug: conjugated estrogens Apply pea sized amount 3 times weekly       Allergies:  No Known Allergies  Family History: Family History  Problem Relation Age of Onset  . Lung cancer Mother   . Breast cancer Paternal Aunt        40's  . Breast cancer Paternal Aunt        In Late 38's.  . Kidney disease Sister     Social History:   reports that she has quit smoking. She has never used smokeless tobacco. She reports current alcohol use. She reports that she does not use drugs.  Physical Exam: BP (!) 148/86 (BP Location: Left Arm, Patient Position: Sitting, Cuff Size: Normal)   Pulse 78   Ht 5\' 7"  (1.702 m)   Wt 200 lb 12.8 oz (91.1  kg)   BMI 31.45 kg/m   Constitutional:  Alert and oriented, no acute distress, nontoxic appearing HEENT: [place], AT Cardiovascular: No clubbing, cyanosis, or edema Respiratory: Normal respiratory effort, no increased work of breathing Skin: No rashes, bruises or suspicious lesions Neurologic: Grossly intact, no focal deficits, moving all 4 extremities Psychiatric: Normal mood and affect  Laboratory Data: Results for orders placed or performed in visit on 09/25/20  Microscopic Examination   Urine  Result Value Ref Range   WBC, UA 0-5 0 - 5 /hpf   RBC 0-2 0 - 2 /hpf   Epithelial Cells (non renal) 0-10 0 - 10 /hpf   Bacteria, UA Few None seen/Few  Urinalysis, Complete  Result Value  Ref Range   Specific Gravity, UA 1.010 1.005 - 1.030   pH, UA 7.0 5.0 - 7.5   Color, UA Yellow Yellow   Appearance Ur Clear Clear   Leukocytes,UA Negative Negative   Protein,UA Negative Negative/Trace   Glucose, UA Negative Negative   Ketones, UA Negative Negative   RBC, UA Trace (A) Negative   Bilirubin, UA Negative Negative   Urobilinogen, Ur 0.2 0.2 - 1.0 mg/dL   Nitrite, UA Negative Negative   Microscopic Examination See below:   BLADDER SCAN AMB NON-IMAGING  Result Value Ref Range   Scan Result 17 mL   Assessment & Plan:   1. Recurrent UTI UA benign, will send for culture for further evaluation.  In the absence of irritative voiding symptoms, recommend deferring therapy. - Urinalysis, Complete - CULTURE, URINE COMPREHENSIVE  2. Urinary urgency Stable at baseline, accompanied with possibly increased frequency over the past several days.  I suspect an element of OAB dry.  Recommend a trial of Myrbetriq 25 mg daily with plans for symptom recheck and PVR in 4 weeks.  She expressed understanding. - BLADDER SCAN AMB NON-IMAGING - mirabegron ER (MYRBETRIQ) 25 MG TB24 tablet; Take 1 tablet (25 mg total) by mouth daily.  Dispense: 28 tablet; Refill: 0   Return in about 4 weeks (around 10/23/2020) for Symptom recheck with PVR.   I spent 35 minutes on the day of the encounter to include pre-visit record review, face-to-face time with the patient, and post-visit ordering of tests.   Carman Ching, PA-C  [facility] [address] [place], [place] [address] [phone]

## 2020-09-26 LAB — URINALYSIS, COMPLETE
Bilirubin, UA: NEGATIVE
Glucose, UA: NEGATIVE
Ketones, UA: NEGATIVE
Leukocytes,UA: NEGATIVE
Nitrite, UA: NEGATIVE
Protein,UA: NEGATIVE
Specific Gravity, UA: 1.01 (ref 1.005–1.030)
Urobilinogen, Ur: 0.2 mg/dL (ref 0.2–1.0)
pH, UA: 7 (ref 5.0–7.5)

## 2020-09-26 LAB — MICROSCOPIC EXAMINATION

## 2020-09-30 ENCOUNTER — Telehealth: Payer: Self-pay | Admitting: Physician Assistant

## 2020-09-30 LAB — CULTURE, URINE COMPREHENSIVE

## 2020-09-30 NOTE — Telephone Encounter (Signed)
Please contact the patient and inquire if she is still experiencing slightly increased urinary frequency, alternatively dysuria or urgency.  If so, I recommend treatment based on her recent urine culture with cefuroxime 250 mg twice daily x5 days.  Otherwise, recommend deferring therapy.

## 2020-10-01 NOTE — Telephone Encounter (Signed)
LMOM for patient to please return call. See note below.

## 2020-10-06 MED ORDER — CEFUROXIME AXETIL 250 MG PO TABS: 250.0000 mg | ORAL_TABLET | Freq: Two times a day (BID) | ORAL | 0 refills | Status: AC

## 2020-10-06 NOTE — Telephone Encounter (Signed)
Patient returned call and would like rx sent to [facility], pt is still having the odor. rx sent to pharmacy by e-script

## 2020-10-06 NOTE — Addendum Note (Signed)
Addended by: Sueanne Margarita on: 10/06/2020 08:28 AM   Modules accepted: Orders

## 2020-10-20 ENCOUNTER — Other Ambulatory Visit: Payer: Self-pay | Admitting: Student

## 2020-10-20 DIAGNOSIS — Z1231 Encounter for screening mammogram for malignant neoplasm of breast: Secondary | ICD-10-CM

## 2020-10-27 ENCOUNTER — Ambulatory Visit (INDEPENDENT_AMBULATORY_CARE_PROVIDER_SITE_OTHER): Payer: Medicare Other | Admitting: Physician Assistant

## 2020-10-27 ENCOUNTER — Encounter: Payer: Self-pay | Admitting: Physician Assistant

## 2020-10-27 ENCOUNTER — Other Ambulatory Visit: Payer: Self-pay

## 2020-10-27 VITALS — BP 143/87 | HR 80 | Ht 64.0 in | Wt 200.0 lb

## 2020-10-27 DIAGNOSIS — R3915 Urgency of urination: Secondary | ICD-10-CM | POA: Diagnosis not present

## 2020-10-27 LAB — BLADDER SCAN AMB NON-IMAGING: Scan Result: 12

## 2020-10-27 MED ORDER — MIRABEGRON ER 25 MG PO TB24: 25.0000 mg | ORAL_TABLET | Freq: Every day | ORAL | 11 refills | Status: DC

## 2020-10-27 NOTE — Progress Notes (Signed)
10/27/2020 10:29 AM   Michele Todd 1951-08-13 [id number]  CC: Chief Complaint  Patient presents with  . Other    HPI: Michele Todd is a 69 y.o. female with PMH microscopic hematuria, urinary urgency, and recurrent UTI on cranberry tablets and topical vaginal estrogen cream who presents today for symptom recheck on Myrbetriq 25 mg daily.  I saw her in clinic most recently on 09/25/2020 for evaluation of stable baseline urinary urgency and malodorous urine associated with slightly increased urinary frequency over baseline.  Urine culture ultimately grew ampicillin, Cipro, tetracycline, and Bactrim resistant E. coli.  I treated her with cefuroxime.  Today she reports resolution of malodorous urine.  She denies dysuria.  She completed Myrbetriq samples and states her urinary frequency has decreased from every hour to every 3-4 hours.  Her urinary urgency has also improved and she no longer feels she has to run to the restroom when she feels a sense of urgency.  She denies nocturia or urinary leakage.  She occasionally wears panty liners for security but does not wet them.  PMH: Past Medical History:  Diagnosis Date  . Headache    sinus  . Numbness and tingling of right lower extremity    R/T pinched nerve  . Urinary tract infection   . Vertigo    x1, over 15 yrs ago    Surgical History: Past Surgical History:  Procedure Laterality Date  . ABDOMINAL HYSTERECTOMY    . CATARACT EXTRACTION, BILATERAL    . CHOLECYSTECTOMY    . KNEE ARTHROSCOPY WITH MENISCAL REPAIR Right 08/23/2017   Procedure: KNEE ARTHROSCOPY PARTIAL MEDIAL AND LATERNAL MENISCECTOMY WITH POSSIBLE CHONDROPLASTY /DEBRIDEMENT OF PATELLAFEMORAL COMPARTMENT;  Surgeon: Signa Kell, MD;  Location: [facility];  Service: Orthopedics;  Laterality: Right;  . TONSILLECTOMY      Home Medications:  Allergies as of 10/27/2020   No Known Allergies     Medication List       Accurate as of October 27, 2020  10:29 AM. If you have any questions, ask your nurse or doctor.        b complex vitamins tablet Take 1 tablet by mouth daily.   cetirizine 10 MG tablet Commonly known as: ZYRTEC Take by mouth.   ibuprofen 200 MG tablet Commonly known as: ADVIL Take 200 mg by mouth every 6 (six) hours as needed.   mirabegron ER 25 MG Tb24 tablet Commonly known as: MYRBETRIQ Take 1 tablet (25 mg total) by mouth daily.   MULTIVITAMIN ADULTS PO Take by mouth daily.   OSTEO BI-FLEX REGULAR STRENGTH PO Take by mouth daily.   Premarin vaginal cream Generic drug: conjugated estrogens Apply pea sized amount 3 times weekly       Allergies:  No Known Allergies  Family History: Family History  Problem Relation Age of Onset  . Lung cancer Mother   . Breast cancer Paternal Aunt        40's  . Breast cancer Paternal Aunt        In Late 68's.  . Kidney disease Sister     Social History:   reports that she has quit smoking. She has never used smokeless tobacco. She reports current alcohol use. She reports that she does not use drugs.  Physical Exam: BP (!) 143/87   Pulse 80   Ht 5\' 4"  (1.626 m)   Wt 200 lb (90.7 kg)   BMI 34.33 kg/m   Constitutional:  Alert and oriented, no acute distress, nontoxic  appearing HEENT: [place], AT Cardiovascular: No clubbing, cyanosis, or edema Respiratory: Normal respiratory effort, no increased work of breathing Skin: No rashes, bruises or suspicious lesions Neurologic: Grossly intact, no focal deficits, moving all 4 extremities Psychiatric: Normal mood and affect  Laboratory Data: Results for orders placed or performed in visit on 10/27/20  Bladder Scan (Post Void Residual) in office  Result Value Ref Range   Scan Result 12    Assessment & Plan:   1. Urinary urgency Significant improvement on Myrbetriq 25 mg daily.  We will plan to continue this. - Bladder Scan (Post Void Residual) in office - mirabegron ER (MYRBETRIQ) 25 MG TB24 tablet; Take 1  tablet (25 mg total) by mouth daily.  Dispense: 30 tablet; Refill: 11  Return if symptoms worsen or fail to improve.  Carman Ching, PA-C  [facility] [address], [address] [place], [place] [address] [phone]

## 2020-10-28 ENCOUNTER — Other Ambulatory Visit: Payer: Self-pay | Admitting: Physician Assistant

## 2020-10-28 ENCOUNTER — Ambulatory Visit: Payer: Self-pay | Admitting: Physician Assistant

## 2020-11-04 ENCOUNTER — Ambulatory Visit
Admission: RE | Admit: 2020-11-04 | Discharge: 2020-11-04 | Disposition: A | Discharge status: Home / Self Care | Payer: Medicare Other | Source: Ambulatory Visit | Attending: Student | Admitting: Student

## 2020-11-04 ENCOUNTER — Other Ambulatory Visit: Payer: Self-pay

## 2020-11-04 DIAGNOSIS — Z1231 Encounter for screening mammogram for malignant neoplasm of breast: Secondary | ICD-10-CM | POA: Insufficient documentation

## 2020-12-09 ENCOUNTER — Other Ambulatory Visit: Payer: Self-pay | Admitting: *Deleted

## 2020-12-09 DIAGNOSIS — R3915 Urgency of urination: Secondary | ICD-10-CM

## 2020-12-09 MED ORDER — MIRABEGRON ER 25 MG PO TB24: 25.0000 mg | ORAL_TABLET | Freq: Every day | ORAL | 2 refills | Status: DC

## 2021-07-28 ENCOUNTER — Telehealth: Payer: Medicare Other

## 2021-07-28 NOTE — Telephone Encounter (Signed)
Incoming call from patient on triage line in regards to refilling her Myrbetriq. Patient states that her GYN Dr doubled her 25mg  Myrbetriq and she will not have enough to last her till her next appt on Sept 15th. Per 11-10-2005, patient should have her bladder scanned before she will be given samples to hold her over till she can see an MD. Patient scheduled for nurse visit Thursday to have PVR.

## 2021-07-30 ENCOUNTER — Other Ambulatory Visit: Payer: Self-pay

## 2021-07-30 ENCOUNTER — Ambulatory Visit (INDEPENDENT_AMBULATORY_CARE_PROVIDER_SITE_OTHER): Payer: Medicare Other

## 2021-07-30 DIAGNOSIS — R3915 Urgency of urination: Secondary | ICD-10-CM | POA: Diagnosis not present

## 2021-07-30 LAB — BLADDER SCAN AMB NON-IMAGING

## 2021-07-30 NOTE — Progress Notes (Signed)
Patient present today for PVR in order to get samples of Myrbetriq 25mg  tablets to hold her over until upcoming appt. PVR 31mL. Patient given 1 month of samples and confirmed upcoming appt.

## 2021-08-20 ENCOUNTER — Ambulatory Visit
Admission: RE | Admit: 2021-08-20 | Discharge: 2021-08-20 | Disposition: A | Discharge status: Home / Self Care | Payer: Medicare Other | Source: Ambulatory Visit | Attending: Urology | Admitting: Urology

## 2021-08-20 ENCOUNTER — Encounter: Payer: Self-pay | Admitting: Urology

## 2021-08-20 ENCOUNTER — Ambulatory Visit (INDEPENDENT_AMBULATORY_CARE_PROVIDER_SITE_OTHER): Payer: Medicare Other | Admitting: Urology

## 2021-08-20 ENCOUNTER — Ambulatory Visit
Admission: RE | Admit: 2021-08-20 | Discharge: 2021-08-20 | Disposition: A | Discharge status: Home / Self Care | Payer: Medicare Other | Attending: Urology | Admitting: Urology

## 2021-08-20 ENCOUNTER — Other Ambulatory Visit: Payer: Self-pay

## 2021-08-20 VITALS — BP 149/75 | HR 73 | Ht 67.0 in | Wt 163.0 lb

## 2021-08-20 DIAGNOSIS — R3915 Urgency of urination: Secondary | ICD-10-CM

## 2021-08-20 DIAGNOSIS — N390 Urinary tract infection, site not specified: Secondary | ICD-10-CM

## 2021-08-20 DIAGNOSIS — N200 Calculus of kidney: Secondary | ICD-10-CM

## 2021-08-20 DIAGNOSIS — N3281 Overactive bladder: Secondary | ICD-10-CM | POA: Diagnosis not present

## 2021-08-20 MED ORDER — MIRABEGRON ER 50 MG PO TB24: 50.0000 mg | ORAL_TABLET | Freq: Every day | ORAL | 11 refills | Status: DC

## 2021-08-20 MED ORDER — MIRABEGRON ER 50 MG PO TB24: 50.0000 mg | ORAL_TABLET | Freq: Every day | ORAL | 3 refills | Status: DC

## 2021-08-20 MED ORDER — ESTRADIOL 0.1 MG/GM VA CREA: TOPICAL_CREAM | VAGINAL | 12 refills | Status: DC

## 2021-08-20 NOTE — Progress Notes (Signed)
   08/20/2021 1:14 PM   Xee Hollman Pfannenstiel 20-Feb-1951 [id number]  Reason for visit: Follow up recurrent UTIs, OAB, nephrolithiasis  HPI: 70 year old female I saw for the above issues.  I last saw her in September 2021 when her primary complaint was recurrent UTIs, microscopic hematuria, and foul-smelling urine and she underwent work-up.  CT urogram was benign aside from a nonobstructing left 5 mm lower pole stone, and cystoscopy was normal.  We had treated her most recent UTI at that time with Augmentin which had resolved her foul-smelling urine, and started Premarin cream.  She has overall done well in the interim.  She developed prolapse and was seen by her GYN and started on a pessary.  She is not sure if she wants to continue this, and she is strongly considering surgery.  It sounds like she has had some overactive bladder with the pessary in place, and has been on Myrbetriq 25 to 50 mg daily.  It sounds like she has better efficacy with the 50 mg dose.  She drinks primarily water during the day.  She is not having any stress incontinence.  She does well overnight with no urination.  I personally viewed and interpreted her KUB today that does not definitively show the 5 mm left lower pole stone secondary to overlying stool burden.  I recommended continuing the Myrbetriq 50 mg daily, and discussing consideration of prolapse repair with her GYN.  We discussed there are numerous strategies for OAB treatment, and if she has persistent or worsening symptoms after prolapse surgery we could consider alternatives to the 50 mg Myrbetriq daily at that time.  Topical estrogen cream refilled  RTC PA 6 months symptom check after undergoing GYN prolapse surgery   Sondra Come, MD  [facility] [address] [place], [place] [address] [phone]

## 2021-08-27 ENCOUNTER — Ambulatory Visit: Payer: Self-pay | Admitting: Urology

## 2021-09-02 ENCOUNTER — Ambulatory Visit: Payer: Self-pay | Admitting: Urology

## 2021-10-06 HISTORY — PX: OTHER SURGICAL HISTORY: SHX169

## 2021-10-07 ENCOUNTER — Other Ambulatory Visit: Payer: Self-pay | Admitting: Student

## 2021-10-07 DIAGNOSIS — Z1231 Encounter for screening mammogram for malignant neoplasm of breast: Secondary | ICD-10-CM

## 2021-10-09 IMAGING — CT CT ABD-PEL WO/W CM
4 of 12 series · 12 of 46 positions shown, 17 images · IV contrast (omnipaque)
Comparison: Right upper quadrant abdominal ultrasound 10/30/2011.
Chest CT 10/30/2011.

CLINICAL DATA: Microscopic hematuria. Intermittent urinary tract
infections over the last year. Previous cholecystectomy and
hysterectomy.

EXAM:
CT ABDOMEN AND PELVIS WITHOUT AND WITH CONTRAST
TECHNIQUE: Multidetector CT imaging of the abdomen and pelvis was performed
following the standard protocol before and following the bolus
administration of intravenous contrast.
CONTRAST:  125mL OMNIPAQUE IOHEXOL 300 MG/ML  SOLN

[Series 2: abd without pre 5.00 · axial · non-contrast · 0.87mm/px · z∈[-1499,-1434]mm · 2 of 91 slices shown]
[im 13/91  soft-tissue]
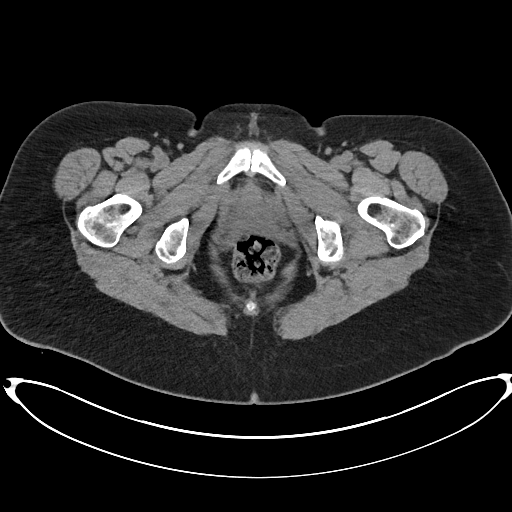
[im 26/91  soft-tissue]
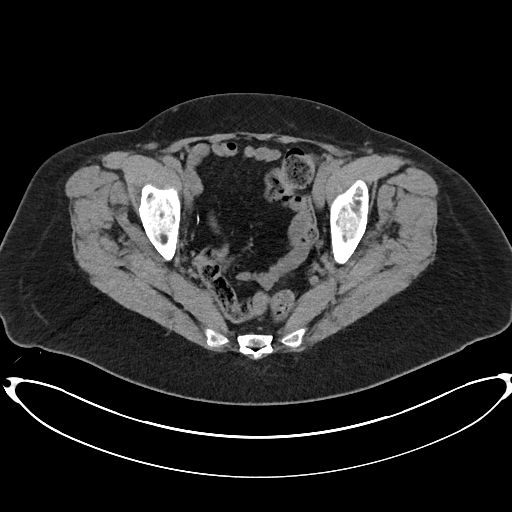

[Series 5: cor without without pre 2.00 cor · coronal · non-contrast · 0.87mm/px · 2 of 143 slices shown]
[im 48/143  soft-tissue]
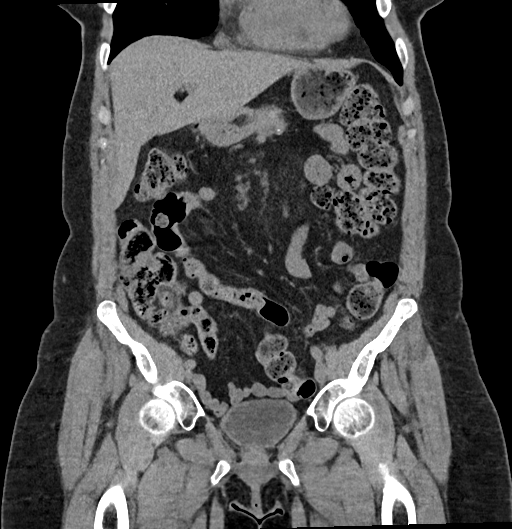
[im 95/143  soft-tissue]
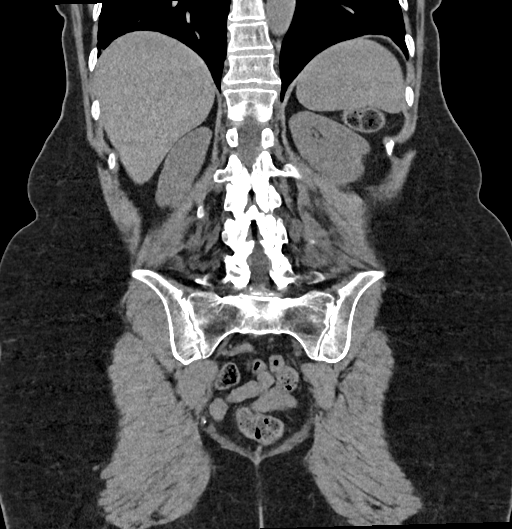

[Series 7: sag without without pre 2.00 sag · sagittal · non-contrast · 0.56mm/px · 1 of 222 slices shown]
[im 26/222  soft-tissue]
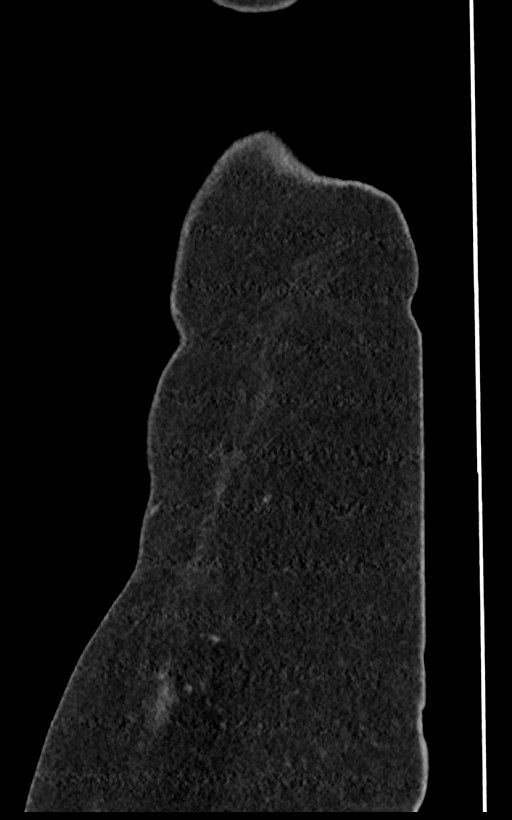

[Series 17: axial delay delay prone 5.00 · axial · delayed · 0.81mm/px · z∈[-1528,-1153]mm · 7 of 101 slices shown, 12 images]
[im 13/101  soft-tissue]
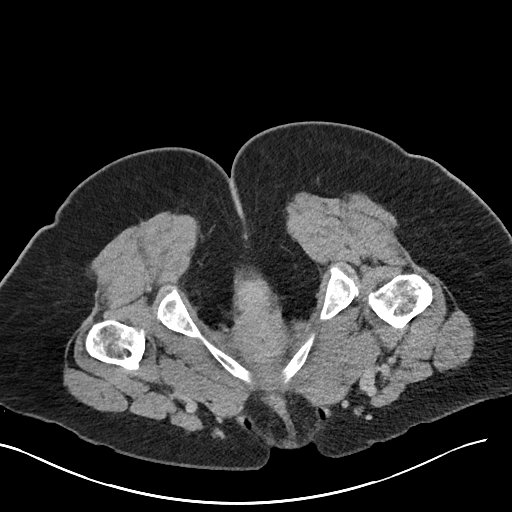
[im 13/101  bone]
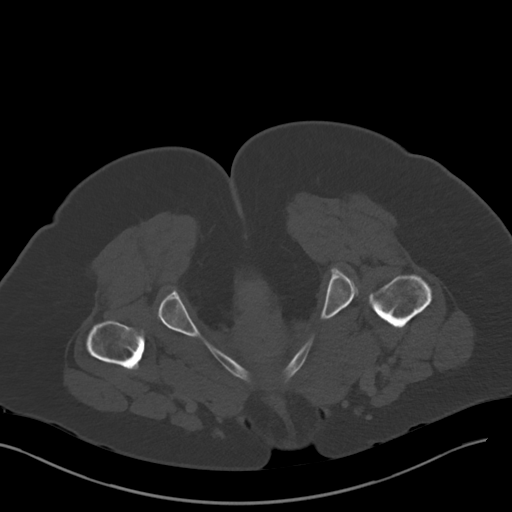
[im 26/101  soft-tissue]
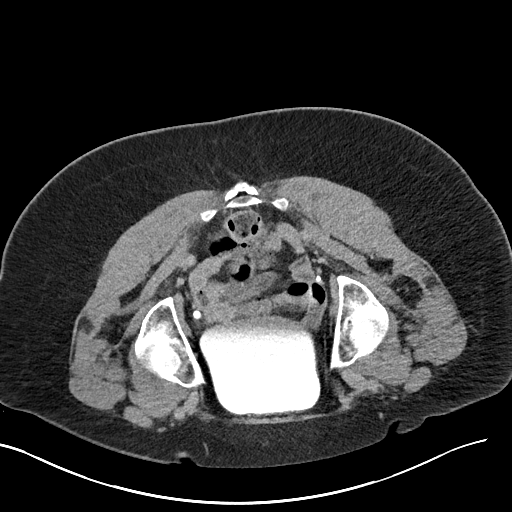
[im 38/101  soft-tissue]
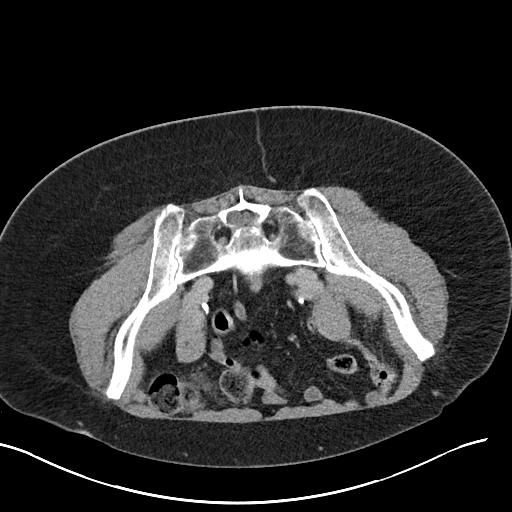
[im 51/101  soft-tissue]
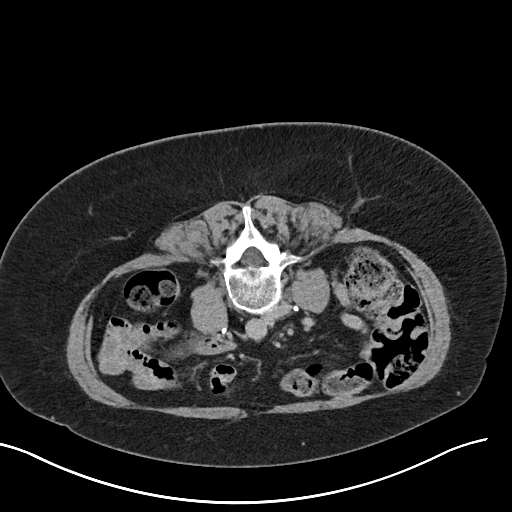
[im 51/101  lung]
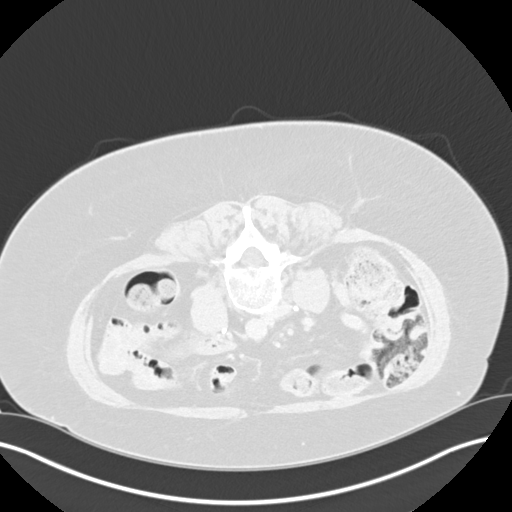
[im 63/101  soft-tissue]
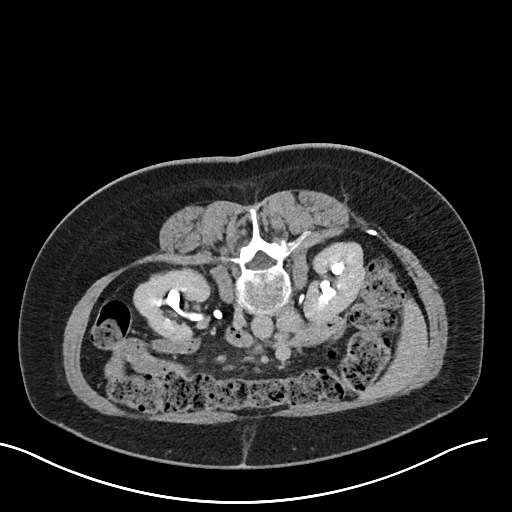
[im 63/101  lung]
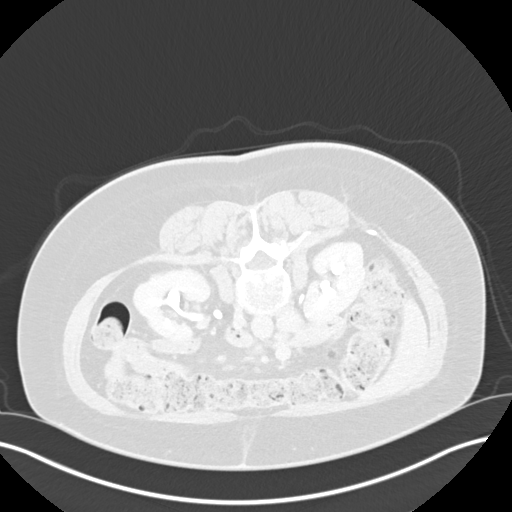
[im 76/101  soft-tissue]
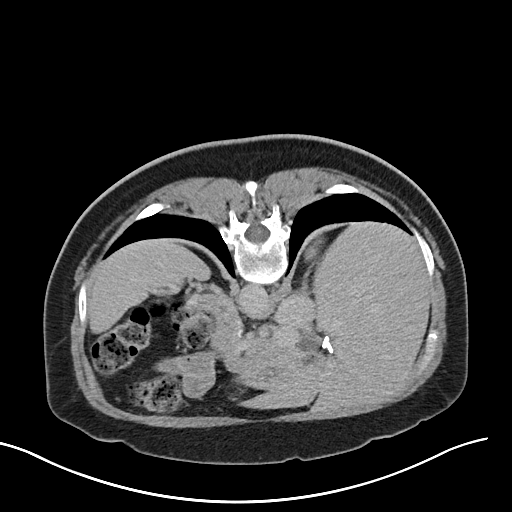
[im 76/101  lung]
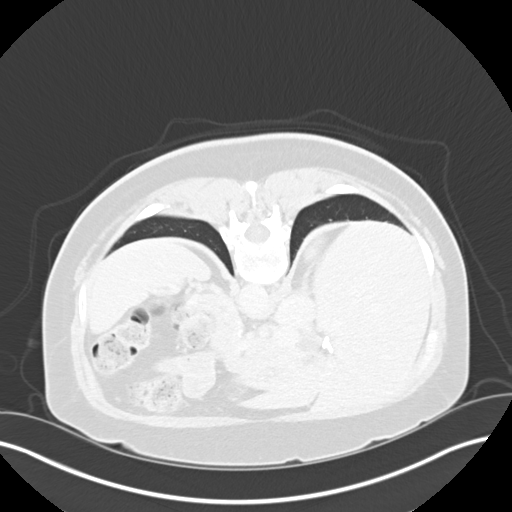
[im 88/101  soft-tissue]
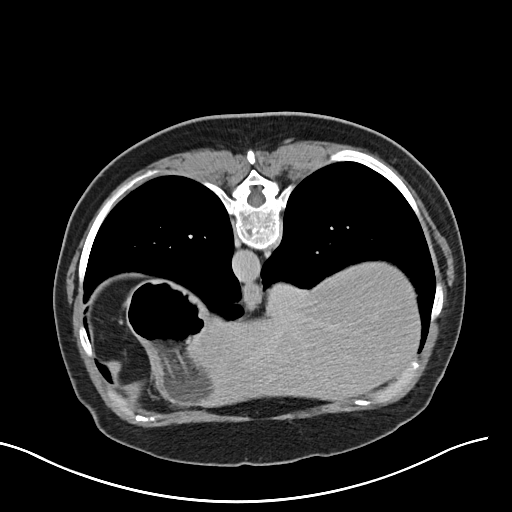
[im 88/101  lung]
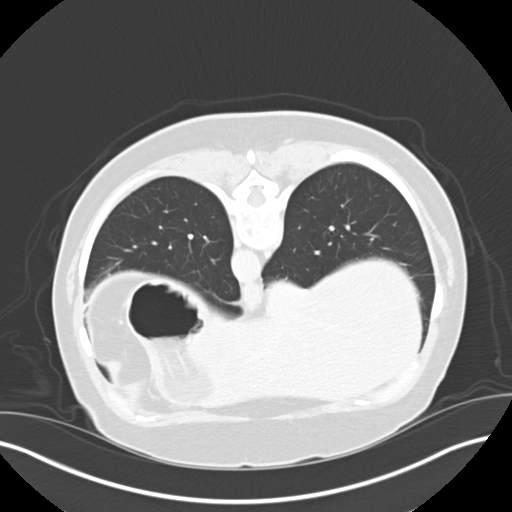

[12 of 46 positions shown; findings below may reference images not displayed]

FINDINGS: Lower chest: Stable tiny nodules at the right lung base are
unchanged from remote chest CT, consistent with benign findings.
There is a small calcified right lower lobe granuloma. Trace
pericardial fluid or thickening, stable. No significant pleural
effusion.

Hepatobiliary: The liver is normal in density without suspicious
focal abnormality. No significant biliary dilatation post
cholecystectomy.

Pancreas: 7 mm cystic lesion in the pancreatic head on image [DATE],
not previously imaged. The pancreas otherwise appears normal. There
is no pancreatic ductal dilatation or surrounding inflammatory
change.

Spleen: Normal in size without focal abnormality.

Adrenals/Urinary Tract: Both adrenal glands appear normal. Pre
contrast images demonstrated nonobstructing 5 mm calculus in the
lower pole of the left kidney. There is no evidence of ureteral or
bladder calculus. Post-contrast, both kidneys enhance normally.
There is no evidence of enhancing renal mass. Delayed images result
in segmental visualization of the ureters. No focal upper tract
urothelial abnormalities are identified. The left ureter is
partially duplicated. No bladder abnormalities are seen.

Stomach/Bowel: No evidence of bowel wall thickening, distention or
surrounding inflammatory change. There is a proximal duodenal
diverticula. The appendix appears normal. Moderate stool throughout
the colon.

Vascular/Lymphatic: There are no enlarged abdominal or pelvic lymph
nodes. Minimal aortic and branch vessel atherosclerosis. No acute
vascular findings.

Reproductive: Hysterectomy.  No adnexal mass.

Other: No evidence of abdominal wall mass or hernia. No ascites.

Musculoskeletal: No acute or significant osseous findings. Mild
lumbar spondylosis and osteitis pubis.
IMPRESSION: 1. Nonobstructing 5 mm calculus in the lower pole of the left
kidney. No evidence of ureteral calculus or hydronephrosis.
2. No evidence of renal mass or focal upper tract urothelial lesion.
The left ureter is partially duplicated.
3. 7 mm cystic lesion in the pancreatic head, not previously imaged,
without aggressive characteristics. Although unlikely to be
clinically significant, consider further evaluation with CT or MRI
in 2 years per consensus guidelines. This recommendation follows ACR
consensus guidelines: Management of Incidental Pancreatic Cysts: A
White Paper of the ACR Incidental Findings Committee. [HOSPITAL] 8180;[DATE].
4. Aortic Atherosclerosis (WHZ8Y-ETM.M).

## 2021-10-09 NOTE — H&P (Signed)
Michele Todd is a 70 y.o. female here for ANterior and posterior colporrhaphy  .s/p hysterectomy  Tried pessary and dislikes     . She has been using a cube pessary for 4 months . She wants now to consider  Surgical intervention for 3 degree cystocele with valsalva , second degree rectocele Some recent spotting  She is not sexually active  Past Medical History:  has a past medical history of Depression, Endometrial cancer (CMS-HCC) (2007), Hemorrhoids, Hyperlipidemia, Osteopenia, and Vertigo.  Past Surgical History:  has a past surgical history that includes Knee arthroscopy (Right, 08/23/2017); Cataract extraction (Right); Tonsillectomy (1960); Cholecystectomy (2012); Hysterectomy (2007); Colonoscopy; and Colonoscopy (12/12/2018). Family History: family history includes Colon polyps in her father, mother, and sister; Gallbladder disease in her son; Heart disease in her paternal uncle; High blood pressure (Hypertension) in her mother, sister, sister, and sister; Hyperlipidemia (Elevated cholesterol) in her father, mother, and sister; Lung cancer in her mother; Myocardial Infarction (Heart attack) in her maternal grandfather; Myocardial Infarction (Heart attack) (age of onset: 6) in her paternal grandmother; Stroke (age of onset: 63) in her father; Sudden cardiac death in her paternal grandmother; Thyroid disease in her daughter and sister. Social History:  reports that she has never smoked. She has never used smokeless tobacco. She reports current alcohol use. She reports that she does not use drugs. OB/GYN History:          OB History     Gravida  2   Para  2   Term  2   Preterm      AB      Living  2      SAB      IAB      Ectopic      Molar      Multiple      Live Births  2             Allergies: has No Known Allergies. Medications:   Current Outpatient Medications:    b complex vitamins capsule, Take 1 capsule by mouth once daily., Disp: , Rfl:    cetirizine  (ZYRTEC) 10 MG tablet, Take 10 mg by mouth once daily, Disp: , Rfl:    cranberry 400 mg Cap, Take 2 capsules by mouth once daily   , Disp: , Rfl:    FISH OIL-DHA-EPA ORAL, Take 1,200 mg by mouth once daily, Disp: , Rfl:    glucosamine/chondr su A sod (OSTEO BI-FLEX ORAL), Take 2 tablets by mouth once daily., Disp: , Rfl:    Lactobacillus rhamnosus GG (CULTURELLE ORAL), Take 1 capsule by mouth once daily, Disp: , Rfl:    mirabegron (MYRBETRIQ) 25 mg ER Tablet, Take 50 mg by mouth once daily, Disp: , Rfl:    multivitamin tablet, Take 1 tablet by mouth once daily., Disp: , Rfl:    oxyquinoline-sod.lauryl sulfat (TRIMO-SAN [place]LLY) 0.025-0.01 % Gel, 1/2 app per vagina twice weekly, Disp: 30 g, Rfl: 3   conjugated estrogens (PREMARIN) 0.625 mg/gram vaginal cream, Place vaginally 3 (three) times daily    (Patient not taking: No sig reported), Disp: , Rfl:    metroNIDAZOLE (METROGEL) 0.75 % vaginal gel, Place 1 applicator vaginally nightly (Patient not taking: No sig reported), Disp: 70 g, Rfl: 0   Saccharomyces boulardii (FLORASTOR) 250 mg capsule, Take 250 mg by mouth 2 (two) times daily (Patient not taking: No sig reported), Disp: , Rfl:    Review of Systems: General:  No fatigue or weight loss Eyes:                           No vision changes Ears:                            No hearing difficulty Respiratory:                No cough or shortness of breath Pulmonary:                  No asthma or shortness of breath Cardiovascular:           No chest pain, palpitations, dyspnea on exertion Gastrointestinal:          No abdominal bloating, chronic diarrhea, constipations, masses, pain or hematochezia Genitourinary:             No hematuria, dysuria, abnormal vaginal discharge, pelvic pain, Menometrorrhagia Lymphatic:                   No swollen lymph nodes Musculoskeletal:         No muscle weakness Neurologic:                  No extremity weakness, syncope, seizure  disorder Psychiatric:                  No history of depression, delusions or suicidal/homicidal ideation      Exam:       Vitals:    10/12/21   BP: 112/74  Pulse: 85      Body mass index is 25.84 kg/m.   WDWN white/  female in NAD   Lungs: CTA  CV : RRR without murmur   Neck:  no thyromegaly Abdomen: soft , no mass, normal active bowel sounds,  non-tender, no rebound tenderness Pelvic: tanner stage 5 ,  External genitalia: vulva /labia no lesions Urethra: no prolapse Vagina: pink d/c ,3 degree cystocele with valsalva , second degree rectocele + granulation tissue posterior .  Silver nitrate treatment done ( verbal consent obtained) Cervix: absent  Uterus:absent Adnexa:no mass Rectovaginal: not done   Impression:    The primary encounter diagnosis was Encounter for pessary maintenance. Diagnoses of Midline cystocele, Rectocele, and Granulation tissue of vagina were also pertinent to this visit.       Plan:    Anterior +posterior colporrhaphy .  Risks discussed with pt . See [facility] clinic notes

## 2021-10-16 ENCOUNTER — Inpatient Hospital Stay: Admission: RE | Admit: 2021-10-16 | Payer: Federal, State, Local not specified - PPO | Source: Ambulatory Visit

## 2021-10-16 ENCOUNTER — Other Ambulatory Visit: Payer: Self-pay

## 2021-10-16 ENCOUNTER — Other Ambulatory Visit
Admission: RE | Admit: 2021-10-16 | Discharge: 2021-10-16 | Disposition: A | Discharge status: Home / Self Care | Payer: Medicare Other | Source: Ambulatory Visit | Attending: Obstetrics and Gynecology | Admitting: Obstetrics and Gynecology

## 2021-10-16 HISTORY — DX: Personal history of urinary calculi: Z87.442

## 2021-10-16 NOTE — Patient Instructions (Addendum)
Your procedure is scheduled on: Monday 10/26/21 Report to the Registration Desk on the 1st floor of the [facility]. To find out your arrival time, please call [phone] between 1PM - 3PM on: Friday 10/23/21  REMEMBER: Instructions that are not followed completely may result in serious medical risk, up to and including death; or upon the discretion of your surgeon and anesthesiologist your surgery may need to be rescheduled.  Do not eat food after midnight the night before surgery.  No gum chewing, lozengers or hard candies.  You may however, drink CLEAR liquids up to 2 hours before you are scheduled to arrive for your surgery. Do not drink anything within 2 hours of your scheduled arrival time.  Clear liquids include: - water  - apple juice without pulp - gatorade (not RED, PURPLE, OR BLUE) - black coffee or tea (Do NOT add milk or creamers to the coffee or tea) Do NOT drink anything that is not on this list.  In addition, your doctor has ordered for you to drink the provided  Ensure Pre-Surgery Clear Carbohydrate Drink  Drinking this carbohydrate drink up to two hours before surgery helps to reduce insulin resistance and improve patient outcomes. Please complete drinking 2 hours prior to scheduled arrival time.  TAKE THESE MEDICATIONS THE MORNING OF SURGERY WITH A SIP OF WATER: mirabegron ER (MYRBETRIQ) 50 MG TB24 tablet  One week prior to surgery: Stop Anti-inflammatories (NSAIDS) such as Advil, Aleve, Ibuprofen, Motrin, Naproxen, Naprosyn and Aspirin based products such as Excedrin, Goodys Powder, BC Powder. Stop taking b complex vitamins tablet, Glucosamine-Chondroitin (OSTEO BI-FLEX REGULAR STRENGTH PO, Multiple Vitamins-Minerals (MULTIVITAMIN ADULTS PO, Omega-3 Fatty Acids (FISH OIL) 1200 MG CAPS, cetirizine (ZYRTEC) 10 MG tablet and ANY OVER THE COUNTER supplements until after surgery. You may however, continue to take Tylenol if needed for pain up until the day of  surgery.  No Alcohol for 24 hours before or after surgery.  No Smoking including e-cigarettes for 24 hours prior to surgery.  No chewable tobacco products for at least 6 hours prior to surgery.  No nicotine patches on the day of surgery.  Do not use any "recreational" drugs for at least a week prior to your surgery.  Please be advised that the combination of cocaine and anesthesia may have negative outcomes, up to and including death. If you test positive for cocaine, your surgery will be cancelled.  On the morning of surgery brush your teeth with toothpaste and water, you may rinse your mouth with mouthwash if you wish. Do not swallow any toothpaste or mouthwash.  Use CHG Soap as directed on instruction sheet.  Do not wear jewelry, make-up, hairpins, clips or nail polish.  Do not wear lotions, powders, or perfumes.   Do not shave body from the neck down 48 hours prior to surgery just in case you cut yourself which could leave a site for infection.  Also, freshly shaved skin may become irritated if using the CHG soap.  Do not bring valuables to the hospital. [facility] is not responsible for any missing/lost belongings or valuables.   Notify your doctor if there is any change in your medical condition (cold, fever, infection).  Wear comfortable clothing (specific to your surgery type) to the hospital.  After surgery, you can help prevent lung complications by doing breathing exercises.  Take deep breaths and cough every 1-2 hours. Your doctor may order a device called an Incentive Spirometer to help you take deep breaths.  If you  are being discharged the day of surgery, you will not be allowed to drive home. You will need a responsible adult (18 years or older) to drive you home and stay with you that night.   If you are taking public transportation, you will need to have a responsible adult (18 years or older) with you. Please confirm with your physician that it is acceptable  to use public transportation.   Please call the Pre-admissions Testing Dept. at [phone] if you have any questions about these instructions.  Surgery Visitation Policy:  Patients undergoing a surgery or procedure may have one family member or support person with them as long as that person is not COVID-19 positive or experiencing its symptoms.  That person may remain in the waiting area during the procedure and may rotate out with other people.  Inpatient Visitation:    Visiting hours are 7 a.m. to 8 p.m. Up to two visitors ages 16+ are allowed at one time in a patient room. The visitors may rotate out with other people during the day. Visitors must check out when they leave, or other visitors will not be allowed. One designated support person may remain overnight. The visitor must pass COVID-19 screenings, use hand sanitizer when entering and exiting the patient's room and wear a mask at all times, including in the patient's room. Patients must also wear a mask when staff or their visitor are in the room. Masking is required regardless of vaccination status.

## 2021-10-20 ENCOUNTER — Encounter
Admission: RE | Admit: 2021-10-20 | Discharge: 2021-10-20 | Disposition: A | Discharge status: Home / Self Care | Payer: Medicare Other | Source: Ambulatory Visit | Attending: Obstetrics and Gynecology | Admitting: Obstetrics and Gynecology

## 2021-10-20 ENCOUNTER — Other Ambulatory Visit: Payer: Self-pay

## 2021-10-20 DIAGNOSIS — Z01818 Encounter for other preprocedural examination: Secondary | ICD-10-CM

## 2021-10-20 DIAGNOSIS — Z01812 Encounter for preprocedural laboratory examination: Secondary | ICD-10-CM | POA: Insufficient documentation

## 2021-10-20 LAB — TYPE AND SCREEN
ABO/RH(D): A POS
Antibody Screen: NEGATIVE

## 2021-10-26 ENCOUNTER — Ambulatory Visit: Payer: Medicare Other | Admitting: Anesthesiology

## 2021-10-26 ENCOUNTER — Ambulatory Visit
Admission: RE | Admit: 2021-10-26 | Discharge: 2021-10-26 | Disposition: A | Discharge status: Home / Self Care | Payer: Medicare Other | Attending: Obstetrics and Gynecology | Admitting: Obstetrics and Gynecology

## 2021-10-26 ENCOUNTER — Other Ambulatory Visit: Payer: Self-pay

## 2021-10-26 ENCOUNTER — Encounter
Admission: RE | Disposition: A | Discharge status: Home / Self Care | Payer: Self-pay | Source: Home / Self Care | Attending: Obstetrics and Gynecology

## 2021-10-26 ENCOUNTER — Encounter: Payer: Self-pay | Admitting: Obstetrics and Gynecology

## 2021-10-26 DIAGNOSIS — Z01818 Encounter for other preprocedural examination: Secondary | ICD-10-CM

## 2021-10-26 DIAGNOSIS — N816 Rectocele: Secondary | ICD-10-CM | POA: Insufficient documentation

## 2021-10-26 DIAGNOSIS — N8111 Cystocele, midline: Secondary | ICD-10-CM | POA: Diagnosis not present

## 2021-10-26 HISTORY — PX: ANTERIOR AND POSTERIOR REPAIR: SHX5121

## 2021-10-26 LAB — ABO/RH: ABO/RH(D): A POS

## 2021-10-26 SURGERY — ANTERIOR (CYSTOCELE) AND POSTERIOR REPAIR (RECTOCELE)
Anesthesia: General | Site: Perineum

## 2021-10-26 MED ORDER — ONDANSETRON HCL 4 MG/2ML IJ SOLN
4.0000 mg | Freq: Once | INTRAMUSCULAR | Status: AC | PRN
  Administered 2021-10-26: 4 mg via INTRAVENOUS

## 2021-10-26 MED ORDER — LACTATED RINGERS IV SOLN: INTRAVENOUS | Status: DC

## 2021-10-26 MED ORDER — GABAPENTIN 300 MG PO CAPS: 300.0000 mg | ORAL_CAPSULE | ORAL | Status: AC

## 2021-10-26 MED ORDER — KETOROLAC TROMETHAMINE 30 MG/ML IJ SOLN
INTRAMUSCULAR | Status: DC | PRN
Start: 2021-10-26 — End: 2021-10-26
  Administered 2021-10-26: 15 mg via INTRAVENOUS

## 2021-10-26 MED ORDER — POVIDONE-IODINE 10 % EX SWAB: 2.0000 "application " | Freq: Once | CUTANEOUS | Status: DC

## 2021-10-26 MED ORDER — SILVER NITRATE-POT NITRATE 75-25 % EX MISC
CUTANEOUS | Status: AC
  Filled 2021-10-26: qty 10

## 2021-10-26 MED ORDER — HYDROCODONE-ACETAMINOPHEN 5-325 MG PO TABS: 1.0000 | ORAL_TABLET | ORAL | Status: DC | PRN

## 2021-10-26 MED ORDER — ONDANSETRON 4 MG PO TBDP: 4.0000 mg | ORAL_TABLET | Freq: Four times a day (QID) | ORAL | Status: DC | PRN

## 2021-10-26 MED ORDER — 0.9 % SODIUM CHLORIDE (POUR BTL) OPTIME
TOPICAL | Status: DC | PRN
  Administered 2021-10-26: 400 mL

## 2021-10-26 MED ORDER — CHLORHEXIDINE GLUCONATE 0.12 % MT SOLN
OROMUCOSAL | Status: AC
  Administered 2021-10-26: 15 mL via OROMUCOSAL
  Filled 2021-10-26: qty 15

## 2021-10-26 MED ORDER — FENTANYL CITRATE (PF) 100 MCG/2ML IJ SOLN: 25.0000 ug | INTRAMUSCULAR | Status: DC | PRN

## 2021-10-26 MED ORDER — CHLORHEXIDINE GLUCONATE 0.12 % MT SOLN: 15.0000 mL | Freq: Once | OROMUCOSAL | Status: AC

## 2021-10-26 MED ORDER — GABAPENTIN 300 MG PO CAPS
ORAL_CAPSULE | ORAL | Status: AC
  Administered 2021-10-26: 300 mg via ORAL
  Filled 2021-10-26: qty 1

## 2021-10-26 MED ORDER — ACETAMINOPHEN 500 MG PO TABS: 1000.0000 mg | ORAL_TABLET | ORAL | Status: AC

## 2021-10-26 MED ORDER — ONDANSETRON HCL 4 MG/2ML IJ SOLN
INTRAMUSCULAR | Status: AC
  Filled 2021-10-26: qty 2

## 2021-10-26 MED ORDER — MIDAZOLAM HCL 2 MG/2ML IJ SOLN
INTRAMUSCULAR | Status: DC | PRN
Start: 2021-10-26 — End: 2021-10-26
  Administered 2021-10-26: 2 mg via INTRAVENOUS

## 2021-10-26 MED ORDER — KETOROLAC TROMETHAMINE 30 MG/ML IJ SOLN
INTRAMUSCULAR | Status: AC
  Filled 2021-10-26: qty 1

## 2021-10-26 MED ORDER — LIDOCAINE HCL (PF) 2 % IJ SOLN
INTRAMUSCULAR | Status: AC
  Filled 2021-10-26: qty 5

## 2021-10-26 MED ORDER — FAMOTIDINE 20 MG PO TABS: 20.0000 mg | ORAL_TABLET | Freq: Once | ORAL | Status: AC

## 2021-10-26 MED ORDER — LIDOCAINE-EPINEPHRINE 1 %-1:100000 IJ SOLN
INTRAMUSCULAR | Status: AC
  Filled 2021-10-26: qty 1

## 2021-10-26 MED ORDER — ACETAMINOPHEN 500 MG PO TABS
ORAL_TABLET | ORAL | Status: AC
  Administered 2021-10-26: 1000 mg via ORAL
  Filled 2021-10-26: qty 2

## 2021-10-26 MED ORDER — DEXAMETHASONE SODIUM PHOSPHATE 10 MG/ML IJ SOLN
INTRAMUSCULAR | Status: AC
  Filled 2021-10-26: qty 1

## 2021-10-26 MED ORDER — FAMOTIDINE 20 MG PO TABS
ORAL_TABLET | ORAL | Status: AC
  Administered 2021-10-26: 20 mg via ORAL
  Filled 2021-10-26: qty 1

## 2021-10-26 MED ORDER — ESTROGENS CONJUGATED 0.625 MG/GM VA CREA
TOPICAL_CREAM | VAGINAL | Status: AC
  Filled 2021-10-26: qty 30

## 2021-10-26 MED ORDER — DEXAMETHASONE SODIUM PHOSPHATE 10 MG/ML IJ SOLN
INTRAMUSCULAR | Status: DC | PRN
  Administered 2021-10-26: 10 mg via INTRAVENOUS

## 2021-10-26 MED ORDER — CEFAZOLIN SODIUM-DEXTROSE 2-4 GM/100ML-% IV SOLN
2.0000 g | Freq: Once | INTRAVENOUS | Status: AC
  Administered 2021-10-26: 2 g via INTRAVENOUS

## 2021-10-26 MED ORDER — ORAL CARE MOUTH RINSE: 15.0000 mL | Freq: Once | OROMUCOSAL | Status: AC

## 2021-10-26 MED ORDER — CEFAZOLIN SODIUM-DEXTROSE 2-4 GM/100ML-% IV SOLN
INTRAVENOUS | Status: AC
  Filled 2021-10-26: qty 100

## 2021-10-26 MED ORDER — PROPOFOL 10 MG/ML IV BOLUS
INTRAVENOUS | Status: DC | PRN
  Administered 2021-10-26: 130 mg via INTRAVENOUS

## 2021-10-26 MED ORDER — LIDOCAINE-EPINEPHRINE 1 %-1:100000 IJ SOLN
INTRAMUSCULAR | Status: DC | PRN
  Administered 2021-10-26: 10 mL

## 2021-10-26 MED ORDER — FENTANYL CITRATE (PF) 100 MCG/2ML IJ SOLN
INTRAMUSCULAR | Status: AC
  Filled 2021-10-26: qty 2

## 2021-10-26 MED ORDER — EPHEDRINE 5 MG/ML INJ
INTRAVENOUS | Status: AC
  Filled 2021-10-26: qty 5

## 2021-10-26 MED ORDER — FENTANYL CITRATE (PF) 100 MCG/2ML IJ SOLN
INTRAMUSCULAR | Status: DC | PRN
  Administered 2021-10-26 (×2): 25 ug via INTRAVENOUS
  Administered 2021-10-26: 50 ug via INTRAVENOUS

## 2021-10-26 MED ORDER — MIDAZOLAM HCL 2 MG/2ML IJ SOLN
INTRAMUSCULAR | Status: AC
  Filled 2021-10-26: qty 2

## 2021-10-26 MED ORDER — EPHEDRINE SULFATE 50 MG/ML IJ SOLN
INTRAMUSCULAR | Status: DC | PRN
Start: 2021-10-26 — End: 2021-10-26
  Administered 2021-10-26: 10 mg via INTRAVENOUS
  Administered 2021-10-26: 5 mg via INTRAVENOUS

## 2021-10-26 MED ORDER — LIDOCAINE HCL (CARDIAC) PF 100 MG/5ML IV SOSY
PREFILLED_SYRINGE | INTRAVENOUS | Status: DC | PRN
  Administered 2021-10-26: 50 mg via INTRAVENOUS

## 2021-10-26 MED ORDER — ONDANSETRON HCL 4 MG/2ML IJ SOLN
INTRAMUSCULAR | Status: DC | PRN
  Administered 2021-10-26: 4 mg via INTRAVENOUS

## 2021-10-26 MED ORDER — PROPOFOL 10 MG/ML IV BOLUS
INTRAVENOUS | Status: AC
  Filled 2021-10-26: qty 20

## 2021-10-26 SURGICAL SUPPLY — 40 items
BAG DRN RND TRDRP ANRFLXCHMBR (UROLOGICAL SUPPLIES)
BAG URINE DRAIN 2000ML AR STRL (UROLOGICAL SUPPLIES) IMPLANT
BLADE SURG 15 STRL LF DISP TIS (BLADE) ×1 IMPLANT
BLADE SURG 15 STRL SS (BLADE) ×2
CATH FOLEY 2WAY  5CC 16FR (CATHETERS)
CATH FOLEY 2WAY 5CC 16FR (CATHETERS)
CATH ROBINSON RED A/P 16FR (CATHETERS) ×2 IMPLANT
CATH URTH 16FR FL 2W BLN LF (CATHETERS) IMPLANT
DRAPE PERI LITHO V/GYN (MISCELLANEOUS) ×2 IMPLANT
DRAPE SURG 17X11 SM STRL (DRAPES) ×2 IMPLANT
DRAPE UNDER BUTTOCK W/FLU (DRAPES) ×2 IMPLANT
ELECT REM PT RETURN 9FT ADLT (ELECTROSURGICAL) ×2
ELECTRODE REM PT RTRN 9FT ADLT (ELECTROSURGICAL) ×1 IMPLANT
GAUZE 4X4 16PLY ~~LOC~~+RFID DBL (SPONGE) ×2 IMPLANT
GAUZE PACK 2X3YD (PACKING) ×2 IMPLANT
GLOVE SURG SYN 8.0 (GLOVE) ×2 IMPLANT
GOWN STRL REUS W/ TWL LRG LVL3 (GOWN DISPOSABLE) ×3 IMPLANT
GOWN STRL REUS W/ TWL XL LVL3 (GOWN DISPOSABLE) ×1 IMPLANT
GOWN STRL REUS W/TWL LRG LVL3 (GOWN DISPOSABLE) ×6
GOWN STRL REUS W/TWL XL LVL3 (GOWN DISPOSABLE) ×2
HANDLE YANKAUER SUCT BULB TIP (MISCELLANEOUS) ×2 IMPLANT
KIT TURNOVER CYSTO (KITS) ×2 IMPLANT
KIT TURNOVER KIT A (KITS) ×2 IMPLANT
LABEL OR SOLS (LABEL) ×2 IMPLANT
MANIFOLD NEPTUNE II (INSTRUMENTS) ×2 IMPLANT
NEEDLE HYPO 22GX1.5 SAFETY (NEEDLE) ×2 IMPLANT
NS IRRIG 500ML POUR BTL (IV SOLUTION) ×2 IMPLANT
PACK BASIN MINOR ARMC (MISCELLANEOUS) ×2 IMPLANT
PAD OB MATERNITY 4.3X12.25 (PERSONAL CARE ITEMS) ×2 IMPLANT
PAD PREP 24X41 OB/GYN DISP (PERSONAL CARE ITEMS) ×2 IMPLANT
SCRUB EXIDINE 4% CHG 4OZ (MISCELLANEOUS) ×2 IMPLANT
SUT PDS AB 2-0 CT1 27 (SUTURE) ×2 IMPLANT
SUT VIC AB 0 CT1 36 (SUTURE) ×2 IMPLANT
SUT VIC AB 2-0 CT1 36 (SUTURE) IMPLANT
SUT VIC AB 2-0 SH 27 (SUTURE) ×10
SUT VIC AB 2-0 SH 27XBRD (SUTURE) ×5 IMPLANT
SUT VIC AB 3-0 SH 27 (SUTURE)
SUT VIC AB 3-0 SH 27X BRD (SUTURE) IMPLANT
SYR 10ML LL (SYRINGE) ×2 IMPLANT
WATER STERILE IRR 500ML POUR (IV SOLUTION) IMPLANT

## 2021-10-26 NOTE — Discharge Instructions (Signed)

## 2021-10-26 NOTE — Brief Op Note (Signed)
10/26/2021  9:08 AM  PATIENT:  Michele Todd  70 y.o. female  PRE-OPERATIVE DIAGNOSIS:  cystocele, rectocele  POST-OPERATIVE DIAGNOSIS:  cystocele, rectocele  PROCEDURE:  Procedure(s): ANTERIOR (CYSTOCELE) AND POSTERIOR REPAIR (RECTOCELE) (N/A)  SURGEON:  Surgeon(s) and Role:    * Lowana Hable, Ihor Austin, MD - Primary    * Christeen Douglas, MD - Assisting  PHYSICIAN ASSISTANT:   ASSISTANTS: cst   ANESTHESIA:   general  EBL:  5 mL IOF 800cc UO 200  BLOOD ADMINISTERED:none  DRAINS: none   LOCAL MEDICATIONS USED:  LIDOCAINE   SPECIMEN:  No Specimen  DISPOSITION OF SPECIMEN:  N/A  COUNTS:  YES  TOURNIQUET:  * No tourniquets in log *  DICTATION: .Other Dictation: Dictation Number verbal  PLAN OF CARE: Discharge to home after PACU  PATIENT DISPOSITION:  PACU - hemodynamically stable.   Delay start of Pharmacological VTE agent (>24hrs) due to surgical blood loss or risk of bleeding: not applicable

## 2021-10-26 NOTE — Progress Notes (Signed)
Pt nauseated zofran given will continue to monitor.

## 2021-10-26 NOTE — Op Note (Signed)
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  PROCEDURE:  Anterior and posterior colporrhaphy.  SURGEON:  Suzy Bouchard, MD  FIRST ASSISTANTDalbert Garnet, MD  ANESTHESIA:  General endotracheal anesthesia.  INDICATIONS:  A 70 year old gravida 2, para 2, patient with pelvic relaxation, failing conservative treatment with pessary use.  The patient has elected for surgical intervention.  DESCRIPTION OF PROCEDURE:  After adequate general endotracheal anesthesia, the patient was placed in dorsal supine position with the legs in candy cane stirrups.  The patient's lower abdomen, perineum and vagina were prepped and draped in normal sterile  fashion.  Timeout was performed.  The patient did receive 2 grams of IV Ancef prior to commencement of the case.  Straight catheterization of the bladder yielded 200 mL urine.  The vaginal cuff apex was grasped with 2 Allis clamps and third Allis clamp  was placed centrally and the subepithelial vaginal tissues were injected with 1% lidocaine with epinephrine 1:100,000.  A small incision was made at the apex and the vaginal epithelium was opened to approximately 1.5 cm from the urethral meatus.  The  endopelvic fascia was then dissected free from the vaginal epithelium.  Horizontal mattress sutures with 2-0 Vicryl suture was used to incorporate healthy endopelvic fascia and plicated the tissue centrally with reduction of the cystocele approximately 5  of these mattress sutures were placed.  Good closure of defect.  Vaginal epithelium was then trimmed and the vaginal epithelium was closed  with 0 Vicryl suture.  Attention was directed to the rectocele where the hymenal ring was grasped essentially with  2 Allis clamps and the central portion of the rectocele was injected with lidocaine with epinephrine.  The vaginal epithelium was opened centrally and the rectocele was then dissected with blunt and sharp dissection from the vaginal epithelium.   Likewise 2-0 Vicryl mattress sutures were used to incorporate healthy endopelvic fascia centrally and the defect was reduced.  Vaginal epithelium were then trimmed and closed with 0 Vicryl suture.  There were no complications.  The patient's bladder was  then catheterized yielding additional 10 mL of urine.  Good hemostasis was noted.  The patient did receive 15 mg of Toradol at the end of the case.  ESTIMATED BLOOD LOSS:  5 mL  INTRAOPERATIVE FLUIDS:  800 mL  URINE OUTPUT:  200 mL.  The patient tolerated the procedure well and was taken to recovery room in good condition.   PUS D: 10/26/2021 9:45:50 am T: 10/26/2021 11:24:00 am  JOB: [id number]/ [id number]

## 2021-10-26 NOTE — Progress Notes (Signed)
Here for A+P colporrhaphy . Labs reviewed . Allquestions answered . Proceed

## 2021-10-26 NOTE — Anesthesia Postprocedure Evaluation (Signed)
Anesthesia Post Note  Patient: Michele Todd  Procedure(s) Performed: ANTERIOR (CYSTOCELE) AND POSTERIOR REPAIR (RECTOCELE) (Perineum)  Patient location during evaluation: PACU Anesthesia Type: General Level of consciousness: awake and oriented Pain management: satisfactory to patient Vital Signs Assessment: post-procedure vital signs reviewed and stable Respiratory status: spontaneous breathing and respiratory function stable Cardiovascular status: blood pressure returned to baseline Anesthetic complications: no   No notable events documented.   Last Vitals:  Vitals:   10/26/21 0945 10/26/21 0955  BP: (!) 149/76 (!) 177/78  Pulse: 87 89  Resp: 15 16  Temp: 36.6 C (!) 36.2 C  SpO2: 98% 99%    Last Pain:  Vitals:   10/26/21 0955  TempSrc: Temporal  PainSc: 0-No pain                 [name],Kolbey Teichert

## 2021-10-26 NOTE — Anesthesia Procedure Notes (Signed)
Procedure Name: LMA Insertion Date/Time: 10/26/2021 7:47 AM Performed by: Reece Agar, CRNA Pre-anesthesia Checklist: Patient identified, Emergency Drugs available, Suction available and Patient being monitored Patient Re-evaluated:Patient Re-evaluated prior to induction Oxygen Delivery Method: Circle system utilized Preoxygenation: Pre-oxygenation with 100% oxygen Induction Type: IV induction Ventilation: Mask ventilation without difficulty LMA: LMA inserted LMA Size: 3.5 Number of attempts: 1 Placement Confirmation: positive ETCO2 and breath sounds checked- equal and bilateral Tube secured with: Tape Dental Injury: Teeth and Oropharynx as per pre-operative assessment

## 2021-10-26 NOTE — Transfer of Care (Signed)
Immediate Anesthesia Transfer of Care Note  Patient: Michele Todd  Procedure(s) Performed: ANTERIOR (CYSTOCELE) AND POSTERIOR REPAIR (RECTOCELE) (Perineum)  Patient Location: PACU  Anesthesia Type:General  Level of Consciousness: awake, drowsy and patient cooperative  Airway & Oxygen Therapy: Patient Spontanous Breathing and Patient connected to face mask oxygen  Post-op Assessment: Report given to RN and Post -op Vital signs reviewed and stable  Post vital signs: Reviewed and stable  Last Vitals:  Vitals Value Taken Time  BP 117/57 10/26/21 0916  Temp 36.4 C 10/26/21 0915  Pulse 73 10/26/21 0923  Resp 10 10/26/21 0923  SpO2 99 % 10/26/21 0923    Last Pain:  Vitals:   10/26/21 0635  TempSrc: Tympanic  PainSc: 0-No pain         Complications: No notable events documented.

## 2021-10-26 NOTE — Anesthesia Preprocedure Evaluation (Signed)
Anesthesia Evaluation  Patient identified by MRN, date of birth, ID band Patient awake    Airway Mallampati: II       Dental  (+) Teeth Intact   Pulmonary neg pulmonary ROS, former smoker,    breath sounds clear to auscultation       Cardiovascular Exercise Tolerance: Good  Rhythm:Regular Rate:Normal     Neuro/Psych  Headaches, negative psych ROS   GI/Hepatic negative GI ROS, Neg liver ROS,   Endo/Other  negative endocrine ROS  Renal/GU negative Renal ROS  negative genitourinary   Musculoskeletal   Abdominal Normal abdominal exam  (+)   Peds negative pediatric ROS (+)  Hematology negative hematology ROS (+)   Anesthesia Other Findings   Reproductive/Obstetrics                             Anesthesia Physical Anesthesia Plan  ASA: 2  Anesthesia Plan: General   Post-op Pain Management:    Induction: Intravenous  PONV Risk Score and Plan: 1 and Ondansetron  Airway Management Planned: LMA  Additional Equipment:   Intra-op Plan:   Post-operative Plan:   Informed Consent: I have reviewed the patients History and Physical, chart, labs and discussed the procedure including the risks, benefits and alternatives for the proposed anesthesia with the patient or authorized representative who has indicated his/her understanding and acceptance.       Plan Discussed with: CRNA and Surgeon  Anesthesia Plan Comments:         Anesthesia Quick Evaluation

## 2021-10-27 ENCOUNTER — Encounter: Payer: Self-pay | Admitting: Obstetrics and Gynecology

## 2021-11-06 ENCOUNTER — Other Ambulatory Visit: Payer: Self-pay

## 2021-11-06 ENCOUNTER — Ambulatory Visit
Admission: RE | Admit: 2021-11-06 | Discharge: 2021-11-06 | Disposition: A | Discharge status: Home / Self Care | Payer: Medicare Other | Source: Ambulatory Visit | Attending: Student | Admitting: Student

## 2021-11-06 DIAGNOSIS — Z1231 Encounter for screening mammogram for malignant neoplasm of breast: Secondary | ICD-10-CM | POA: Diagnosis not present

## 2021-11-23 ENCOUNTER — Other Ambulatory Visit: Payer: Self-pay | Admitting: *Deleted

## 2022-02-17 ENCOUNTER — Ambulatory Visit (INDEPENDENT_AMBULATORY_CARE_PROVIDER_SITE_OTHER): Payer: Medicare Other | Admitting: Urology

## 2022-02-17 ENCOUNTER — Other Ambulatory Visit: Payer: Self-pay

## 2022-02-17 ENCOUNTER — Encounter: Payer: Self-pay | Admitting: Urology

## 2022-02-17 VITALS — BP 163/71 | HR 75 | Ht 67.0 in | Wt 172.0 lb

## 2022-02-17 DIAGNOSIS — R3915 Urgency of urination: Secondary | ICD-10-CM | POA: Diagnosis not present

## 2022-02-17 DIAGNOSIS — N952 Postmenopausal atrophic vaginitis: Secondary | ICD-10-CM | POA: Diagnosis not present

## 2022-02-17 LAB — BLADDER SCAN AMB NON-IMAGING

## 2022-02-17 NOTE — Progress Notes (Signed)
? ? ?02/17/2022 ?5:18 PM  ? ?Michele Todd ?1951-07-26 ?[id number] ? ?Referring provider: Wayland Denis, PA-C ?[facility] ?[place],  [place] [address] ? ?Chief Complaint  ?Patient presents with  ? Urinary Urgency  ? ?Urological history: ?1. rUTI's ?-Contributing factors of age, vaginal atrophy, incontinence and constipation ?-Documented positive urine cultures over the last year ? None ?-managed with vaginal estrogen cream and cranberry tablets  ? ?2. High risk hematuria ?-non smoker ?-CTU 2021 - left nephrolithiasis ?-Cysto 2021 - NED ?-no reports of gross heme ?-UA x 2 - neg for micro heme ? ?3. Nephrolithiasis ?-CTU 2021 Nonobstructing 5 mm calculus in the lower pole of the left ?Kidney ?-KUB 2022 extensive stool burden  ? ?4. Stress urinary incontinence ?-s/p anterior and posterior colporrhaphy 10/2021 by Dr. Ouida Sills ?-PVR 0 mL ? ?5. OAB ?-Contributing factors of age, vaginal atrophy, pelvic surgery and constipation ?-PVR 0 mL ?-Managed with Myrbetriq 50 mg daily ? ?HPI: ?Michele Todd is a 71 y.o. female who presents today for 6 month follow up. ? ?1-7 daytime voids ?No nocturia ?Mild urge to urinate ?Occasional stress incontinence ?Wearing 3 panty liners daily ?Engages in toilet mapping ?Taking Myrbetriq 50 mg daily ?Applying vaginal estrogen cream Monday, Wednesday and Friday nights ?PVR 0 mL ? ?Patient denies any modifying or aggravating factors.  Patient denies any gross hematuria, dysuria or suprapubic/flank pain.  Patient denies any fevers, chills, nausea or vomiting.   ? ?At goal with urinary symptoms.   ? ? ?PMH: ?Past Medical History:  ?Diagnosis Date  ? Headache   ? sinus  ? History of kidney stones   ? Numbness and tingling of right lower extremity   ? R/T pinched nerve  ? Urinary tract infection   ? Vertigo   ? x1, over 15 yrs ago  ? ? ?Surgical History: ?Past Surgical History:  ?Procedure Laterality Date  ? ABDOMINAL HYSTERECTOMY    ? ANTERIOR AND POSTERIOR REPAIR  N/A 10/26/2021  ? Procedure: ANTERIOR (CYSTOCELE) AND POSTERIOR REPAIR (RECTOCELE);  Surgeon: Schermerhorn, Gwen Her, MD;  Location: [facility];  Service: Gynecology;  Laterality: N/A;  ? CATARACT EXTRACTION, BILATERAL    ? CHOLECYSTECTOMY    ? KNEE ARTHROSCOPY WITH MENISCAL REPAIR Right 08/23/2017  ? Procedure: KNEE ARTHROSCOPY PARTIAL MEDIAL AND LATERNAL MENISCECTOMY WITH POSSIBLE CHONDROPLASTY /DEBRIDEMENT OF PATELLAFEMORAL COMPARTMENT;  Surgeon: Leim Fabry, MD;  Location: [facility];  Service: Orthopedics;  Laterality: Right;  ? TONSILLECTOMY    ? ? ?Home Medications:  ?Allergies as of 02/17/2022   ?No Known Allergies ?  ? ?  ?Medication List  ?  ? ?  ? Accurate as of February 17, 2022 11:59 PM. If you have any questions, ask your nurse or doctor.  ?  ?  ? ?  ? ?atorvastatin 20 MG tablet ?Commonly known as: LIPITOR ?Take 20 mg by mouth at bedtime. ?  ?b complex vitamins tablet ?Take 1 tablet by mouth daily. ?  ?bisacodyl 5 MG EC tablet ?Commonly known as: DULCOLAX ?Take 5 mg by mouth daily as needed for moderate constipation. ?  ?cetirizine 10 MG tablet ?Commonly known as: ZYRTEC ?Take 10 mg by mouth daily. ?  ?estradiol 0.1 MG/GM vaginal cream ?Commonly known as: ESTRACE ?Estrogen Cream Instruction Discard applicator Apply pea sized amount to tip of finger to urethra before bed. Wash hands well after application. Use Monday, Wednesday and Friday ?  ?Fish Oil 1200 MG Caps ?Take 1,200 mg by mouth daily. ?  ?hydroxypropyl methylcellulose /  hypromellose 2.5 % ophthalmic solution ?Commonly known as: ISOPTO TEARS / GONIOVISC ?Place 1 drop into both eyes at bedtime. ?  ?MULTIVITAMIN ADULTS PO ?Take 1 tablet by mouth daily. ?  ?Myrbetriq 50 MG Tb24 tablet ?Generic drug: mirabegron ER ?Take 50 mg by mouth daily. ?  ?OSTEO BI-FLEX REGULAR STRENGTH PO ?Take 2 tablets by mouth daily. ?  ?sodium chloride 0.65 % nasal spray ?Commonly known as: OCEAN ?Place 1 spray into the nose as needed for congestion. ?  ? ?   ? ? ?Allergies: No Known Allergies ? ?Family History: ?Family History  ?Problem Relation Age of Onset  ? Lung cancer Mother   ? Breast cancer Paternal Aunt   ?     84's  ? Breast cancer Paternal Aunt   ?     In Late 50's.  ? Kidney disease Sister   ? ? ?Social History:  reports that she has quit smoking. She has never used smokeless tobacco. She reports current alcohol use. She reports that she does not use drugs. ? ?ROS: ?Pertinent ROS in HPI ? ?Physical Exam: ?BP (!) 163/71   Pulse 75   Ht $R'5\' 7"'PM$  (1.702 m)   Wt 172 lb (78 kg)   BMI 26.94 kg/m?   ?Constitutional:  Well nourished. Alert and oriented, No acute distress. ?HEENT: [place] AT, mask in place.  Trachea midline. ?Cardiovascular: No clubbing, cyanosis, or edema. ?Respiratory: Normal respiratory effort, no increased work of breathing. ?Neurologic: Grossly intact, no focal deficits, moving all 4 extremities. ?Psychiatric: Normal mood and affect.   ? ?Laboratory Data: ?Glucose 70 - 110 mg/dL 83   ?Sodium 136 - 145 mmol/L 140   ?Potassium 3.6 - 5.1 mmol/L 4.5   ?Chloride 97 - 109 mmol/L 106   ?Carbon Dioxide (CO2) 22.0 - 32.0 mmol/L 30.8   ?Urea Nitrogen (BUN) 7 - 25 mg/dL 19   ?Creatinine 0.6 - 1.1 mg/dL 0.6   ?Glomerular Filtration Rate (eGFR), MDRD Estimate >60 mL/min/1.73sq m 99   ?Calcium 8.7 - 10.3 mg/dL 10.1   ?AST  8 - 39 U/L 24   ?ALT  5 - 38 U/L 28   ?Alk Phos (alkaline Phosphatase) 34 - 104 U/L 60   ?Albumin 3.5 - 4.8 g/dL 4.3   ?Bilirubin, Total 0.3 - 1.2 mg/dL 0.5   ?Protein, Total 6.1 - 7.9 g/dL 6.9   ?A/G Ratio 1.0 - 5.0 gm/dL 1.7   ?Resulting Agency  [facility]  ?Specimen Collected: 10/09/21 08:05 Last Resulted: 10/09/21 12:31  ?Received From: [facility]  Result Received: 10/13/21 14:11  ? ?WBC (White Blood Cell Count) 4.1 - 10.2 10?3/uL 7.4   ?RBC (Red Blood Cell Count) 4.04 - 5.48 10?6/uL 4.42   ?Hemoglobin 12.0 - 15.0 gm/dL 13.3   ?Hematocrit 35.0 - 47.0 % 41.0   ?MCV (Mean Corpuscular Volume) 80.0 - 100.0 fl  92.8   ?MCH (Mean Corpuscular Hemoglobin) 27.0 - 31.2 pg 30.1   ?MCHC (Mean Corpuscular Hemoglobin Concentration) 32.0 - 36.0 gm/dL 32.4   ?Platelet Count 150 - 450 10?3/uL 244   ?RDW-CV (Red Cell Distribution Width) 11.6 - 14.8 % 13.6   ?MPV (Mean Platelet Volume) 9.4 - 12.4 fl 9.7   ?Neutrophils 1.50 - 7.80 10?3/uL 5.20   ?Lymphocytes 1.00 - 3.60 10?3/uL 1.70   ?Mixed Count 0.10 - 0.90 10?3/uL 0.50   ?Neutrophil % 32.0 - 70.0 % 70.2 High    ?Lymphocyte % 10.0 - 50.0 % 23.6   ?Mixed % 3.0 -  14.4 % 6.2   ?[facility]  ?Specimen Collected: 10/09/21 08:05 Last Resulted: 10/09/21 11:50  ?Received From: [facility]  Result Received: 10/09/21 18:21  ?I have reviewed the labs. ? ? ?Pertinent Imaging: ? 02/17/22 13:35  ?Scan Result 52m  ? ?Assessment & Plan:   ? ?1. Urinary urgency ?- Bladder Scan (Post Void Residual) in office ?-At goal Myrbetriq 50 mg daily ? ?2. SUI ?-s/p A & P repair 10/2021 ? ?3. Vaginal atrophy ?-Continue vaginal estrogen cream Monday, Wednesday and Friday nights ? ?4. rUTI's ?-Asymptomatic at this visit ?-Continue preventative strategies ? ?5. High risk hematuria ?-work up 2021 - left renal stone ?-no reports of gross heme ?-UA x 2 w/o micro heme ? ?6. Left renal stone ?-follow clinically  ? ? ?Return in about 1 year (around 02/18/2023) for OAB questionairre and PVR . ? ?These notes generated with voice recognition software. I apologize for typographical errors. ? ?Daquann Merriott, PA-C ? ?[facility]?[address] [place] [id number]?[phone]?  ?

## 2022-06-08 ENCOUNTER — Other Ambulatory Visit: Payer: Self-pay

## 2022-06-08 ENCOUNTER — Emergency Department
Admission: EM | Admit: 2022-06-08 | Discharge: 2022-06-08 | Disposition: A | Discharge status: Home / Self Care | Payer: Medicare Other | Attending: Emergency Medicine | Admitting: Emergency Medicine

## 2022-06-08 DIAGNOSIS — H1033 Unspecified acute conjunctivitis, bilateral: Secondary | ICD-10-CM | POA: Diagnosis not present

## 2022-06-08 DIAGNOSIS — H5789 Other specified disorders of eye and adnexa: Secondary | ICD-10-CM | POA: Diagnosis present

## 2022-06-08 MED ORDER — OFLOXACIN 0.3 % OP SOLN
2.0000 [drp] | Freq: Four times a day (QID) | OPHTHALMIC | Status: DC
  Administered 2022-06-08: 2 [drp] via OPHTHALMIC
  Filled 2022-06-08: qty 5

## 2022-06-08 MED ORDER — OFLOXACIN 0.3 % OP SOLN: 2.0000 [drp] | Freq: Four times a day (QID) | OPHTHALMIC | 0 refills | Status: AC

## 2022-06-08 NOTE — Discharge Instructions (Addendum)
-  Continue to take the antibiotics as prescribed.  -Follow-up with your primary care provider in 1 week if your symptoms fail to resolve  -You may take Tylenol/ibuprofen as needed for pain/irritation.  Recommend warm compresses as well.  -Avoid contact with others to prevent further spread of the infection

## 2022-06-08 NOTE — ED Provider Notes (Signed)
[facility] Provider Note    None    (approximate)   History   Chief Complaint Eye Problem   HPI Michele Todd is a 71 y.o. female, history of osteopenia, hyperlipidemia, presents to the emergency department for evaluation of eye drainage x 2 days.  Reports symptoms started as sinus congestion and sore throat approximately 5 days prior, which have mostly resolved.  However, she has experienced significant green/yellow drainage from both of her eyes over the past 2 days that is progressively worsened.  Earlier today, she noticed small amount of blood on her tissues when wiping her eyes today, though states that she has not had any bleeding since.  Denies fever/chills, chest pain, blurry vision, shortness of breath, hearing changes, nausea/vomiting, vertigo, diarrhea, rash/lesions, or dizziness/lightheadedness.  History Limitations: No limitations.        Physical Exam  Triage Vital Signs: ED Triage Vitals  Enc Vitals Group     BP 06/08/22 2130 (!) 149/64     Pulse Rate 06/08/22 2130 74     Resp 06/08/22 2130 18     Temp 06/08/22 2130 98 F (36.7 C)     Temp Source 06/08/22 2130 Oral     SpO2 06/08/22 2130 98 %     Weight 06/08/22 2131 169 lb (76.7 kg)     Height 06/08/22 2131 5\' 7"  (1.702 m)     Head Circumference --      Peak Flow --      Pain Score 06/08/22 2130 0     Pain Loc --      Pain Edu? --      Excl. in GC? --     Most recent vital signs: Vitals:   06/08/22 2130  BP: (!) 149/64  Pulse: 74  Resp: 18  Temp: 98 F (36.7 C)  SpO2: 98%    General: Awake, NAD.  Skin: Warm, dry. No rashes or lesions.  CV: Good peripheral perfusion.  Resp: Normal effort.  Abd: Soft, non-tender. No distention.  Neuro: At baseline. No gross neurological deficits.   Focused Exam: Significant green/yellow drainage from eyes bilaterally.  Erythematous conjunctivae.  Small subconjunctival hemorrhages present.  No active bleeding.  Gross visual acuity  intact.  PERRL.  No pain with EOMI.  Physical Exam    ED Results / Procedures / Treatments  Labs (all labs ordered are listed, but only abnormal results are displayed) Labs Reviewed - No data to display   EKG N/A.   RADIOLOGY  ED Provider Interpretation: N/A.  No results found.  PROCEDURES:  Critical Care performed: N/A.  Procedures    MEDICATIONS ORDERED IN ED: Medications  ofloxacin (OCUFLOX) 0.3 % ophthalmic solution 2 drop (has no administration in time range)     IMPRESSION / MDM / ASSESSMENT AND PLAN / ED COURSE  I reviewed the triage vital signs and the nursing notes.                              Differential diagnosis includes, but is not limited to, viral conjunctivitis, allergic conjunctivitis, bacterial conjunctivitis.   Assessment/Plan Presentation consistent with bacterial conjunctivitis.  Visual acuity is intact.  No evidence of preseptal or orbital cellulitis.  Patient denies any contact lens use or mechanisms suggesting corneal injury.  Initiated treatment here with ofloxacin.  We will provide her with prescription for ofloxacin to take at home as well.  Encouraged her to take Tylenol/ibuprofen  as needed for pain/irritation.  Patient's presentation is most consistent with acute, uncomplicated illness.   Provided the patient with anticipatory guidance, return precautions, and educational material. Encouraged the patient to return to the emergency department at any time if they begin to experience any new or worsening symptoms. Patient expressed understanding and agreed with the plan.      FINAL CLINICAL IMPRESSION(S) / ED DIAGNOSES   Final diagnoses:  Acute bacterial conjunctivitis of both eyes     Rx / DC Orders   ED Discharge Orders          Ordered    ofloxacin (OCUFLOX) 0.3 % ophthalmic solution  4 times daily        06/08/22 2306             Note:  This document was prepared using Dragon voice recognition software and may  include unintentional dictation errors.   Varney Daily, [place] 06/08/22 Janetta Hora    Dionne Bucy, MD 06/13/22 [phone]

## 2022-06-08 NOTE — ED Triage Notes (Signed)
Ambulatory to triage with c/o eye drainage. Reports sx started as sore throat and runny nose but those sx resolved.  Eye drainage began Sunday in right eye with left eye sx starting today. Pt also states she noted some blood stained tissues when wiping eyes today.  Drainage is clear to yellow in color, thick and mucus like.

## 2022-09-06 ENCOUNTER — Other Ambulatory Visit: Payer: Self-pay | Admitting: Urology

## 2022-09-06 MED ORDER — MYRBETRIQ 50 MG PO TB24: 50.0000 mg | ORAL_TABLET | Freq: Every day | ORAL | 3 refills | Status: DC

## 2022-09-06 NOTE — Telephone Encounter (Signed)
Patient says that the Myrbetriq 50mg  samples are working well.   She is requesting a prescription to be sent to [facility] mail order pharmacy.

## 2022-09-21 ENCOUNTER — Other Ambulatory Visit (HOSPITAL_COMMUNITY): Payer: Self-pay | Admitting: Orthopedic Surgery

## 2022-09-21 DIAGNOSIS — M25562 Pain in left knee: Secondary | ICD-10-CM

## 2022-09-24 ENCOUNTER — Ambulatory Visit
Admission: RE | Admit: 2022-09-24 | Discharge: 2022-09-24 | Disposition: A | Discharge status: Home / Self Care | Payer: Medicare Other | Source: Ambulatory Visit | Attending: Orthopedic Surgery | Admitting: Orthopedic Surgery

## 2022-09-24 DIAGNOSIS — M25562 Pain in left knee: Secondary | ICD-10-CM | POA: Diagnosis present

## 2022-10-06 ENCOUNTER — Other Ambulatory Visit: Payer: Self-pay | Admitting: Orthopedic Surgery

## 2022-10-08 ENCOUNTER — Encounter: Payer: Self-pay | Admitting: Orthopedic Surgery

## 2022-10-13 ENCOUNTER — Other Ambulatory Visit: Payer: Self-pay

## 2022-10-13 DIAGNOSIS — Z1231 Encounter for screening mammogram for malignant neoplasm of breast: Secondary | ICD-10-CM

## 2022-10-18 ENCOUNTER — Ambulatory Visit: Payer: Medicare Other | Admitting: Anesthesiology

## 2022-10-18 ENCOUNTER — Other Ambulatory Visit: Payer: Self-pay

## 2022-10-18 ENCOUNTER — Encounter: Payer: Self-pay | Admitting: Orthopedic Surgery

## 2022-10-18 ENCOUNTER — Ambulatory Visit
Admission: RE | Admit: 2022-10-18 | Discharge: 2022-10-18 | Disposition: A | Discharge status: Home / Self Care | Payer: Medicare Other | Attending: Orthopedic Surgery | Admitting: Orthopedic Surgery

## 2022-10-18 ENCOUNTER — Encounter
Admission: RE | Disposition: A | Discharge status: Home / Self Care | Payer: Self-pay | Source: Home / Self Care | Attending: Orthopedic Surgery

## 2022-10-18 DIAGNOSIS — S83282A Other tear of lateral meniscus, current injury, left knee, initial encounter: Secondary | ICD-10-CM | POA: Diagnosis not present

## 2022-10-18 DIAGNOSIS — X58XXXA Exposure to other specified factors, initial encounter: Secondary | ICD-10-CM | POA: Diagnosis not present

## 2022-10-18 DIAGNOSIS — S83242A Other tear of medial meniscus, current injury, left knee, initial encounter: Secondary | ICD-10-CM | POA: Diagnosis present

## 2022-10-18 HISTORY — PX: KNEE ARTHROSCOPY WITH MEDIAL MENISECTOMY: SHX5651

## 2022-10-18 HISTORY — PX: KNEE ARTHROSCOPY WITH LATERAL MENISECTOMY: SHX6193

## 2022-10-18 SURGERY — ARTHROSCOPY, KNEE, WITH LATERAL MENISCECTOMY
Anesthesia: General | Site: Knee | Laterality: Left

## 2022-10-18 MED ORDER — PROPOFOL 10 MG/ML IV BOLUS
INTRAVENOUS | Status: DC | PRN
  Administered 2022-10-18: 150 mg via INTRAVENOUS

## 2022-10-18 MED ORDER — ONDANSETRON HCL 4 MG/2ML IJ SOLN
INTRAMUSCULAR | Status: DC | PRN
  Administered 2022-10-18: 4 mg via INTRAVENOUS

## 2022-10-18 MED ORDER — MIDAZOLAM HCL 5 MG/5ML IJ SOLN
INTRAMUSCULAR | Status: DC | PRN
  Administered 2022-10-18 (×2): 1 mg via INTRAVENOUS

## 2022-10-18 MED ORDER — ACETAMINOPHEN 160 MG/5ML PO SOLN: 960.0000 mg | Freq: Once | ORAL | Status: AC

## 2022-10-18 MED ORDER — DEXAMETHASONE SODIUM PHOSPHATE 4 MG/ML IJ SOLN
INTRAMUSCULAR | Status: DC | PRN
  Administered 2022-10-18: 4 mg via INTRAVENOUS

## 2022-10-18 MED ORDER — OXYCODONE HCL 5 MG/5ML PO SOLN: 5.0000 mg | Freq: Once | ORAL | Status: AC | PRN

## 2022-10-18 MED ORDER — FENTANYL CITRATE (PF) 100 MCG/2ML IJ SOLN
INTRAMUSCULAR | Status: DC | PRN
  Administered 2022-10-18 (×2): 50 ug via INTRAVENOUS

## 2022-10-18 MED ORDER — LACTATED RINGERS IV SOLN: INTRAVENOUS | Status: DC

## 2022-10-18 MED ORDER — ONDANSETRON 4 MG PO TBDP: 4.0000 mg | ORAL_TABLET | Freq: Three times a day (TID) | ORAL | 0 refills | Status: DC | PRN

## 2022-10-18 MED ORDER — CEFAZOLIN SODIUM-DEXTROSE 2-4 GM/100ML-% IV SOLN
2.0000 g | INTRAVENOUS | Status: AC
  Administered 2022-10-18: 2 g via INTRAVENOUS

## 2022-10-18 MED ORDER — LIDOCAINE-EPINEPHRINE 1 %-1:100000 IJ SOLN
INTRAMUSCULAR | Status: DC | PRN
  Administered 2022-10-18: 12 mL via INTRAMUSCULAR

## 2022-10-18 MED ORDER — FENTANYL CITRATE PF 50 MCG/ML IJ SOSY: 25.0000 ug | PREFILLED_SYRINGE | INTRAMUSCULAR | Status: DC | PRN

## 2022-10-18 MED ORDER — HYDROCODONE-ACETAMINOPHEN 5-325 MG PO TABS: 1.0000 | ORAL_TABLET | ORAL | 0 refills | Status: DC | PRN

## 2022-10-18 MED ORDER — ONDANSETRON HCL 4 MG/2ML IJ SOLN: 4.0000 mg | Freq: Once | INTRAMUSCULAR | Status: DC | PRN

## 2022-10-18 MED ORDER — PHENYLEPHRINE HCL (PRESSORS) 10 MG/ML IV SOLN
INTRAVENOUS | Status: DC | PRN
  Administered 2022-10-18: 25 ug via INTRAVENOUS
  Administered 2022-10-18: 50 ug via INTRAVENOUS
  Administered 2022-10-18: 25 ug via INTRAVENOUS

## 2022-10-18 MED ORDER — ACETAMINOPHEN 500 MG PO TABS: 1000.0000 mg | ORAL_TABLET | Freq: Three times a day (TID) | ORAL | 2 refills | Status: DC

## 2022-10-18 MED ORDER — LIDOCAINE HCL (CARDIAC) PF 100 MG/5ML IV SOSY
PREFILLED_SYRINGE | INTRAVENOUS | Status: DC | PRN
  Administered 2022-10-18: 80 mg via INTRATRACHEAL

## 2022-10-18 MED ORDER — OXYCODONE HCL 5 MG PO TABS
5.0000 mg | ORAL_TABLET | Freq: Once | ORAL | Status: AC | PRN
  Administered 2022-10-18: 5 mg via ORAL

## 2022-10-18 MED ORDER — ACETAMINOPHEN 500 MG PO TABS
1000.0000 mg | ORAL_TABLET | Freq: Once | ORAL | Status: AC
  Administered 2022-10-18: 1000 mg via ORAL

## 2022-10-18 MED ORDER — ASPIRIN 325 MG PO TBEC: 325.0000 mg | DELAYED_RELEASE_TABLET | Freq: Every day | ORAL | 0 refills | Status: AC

## 2022-10-18 MED ORDER — LACTATED RINGERS IR SOLN
Status: DC | PRN
  Administered 2022-10-18: 6000 mL

## 2022-10-18 MED ORDER — IBUPROFEN 800 MG PO TABS: 800.0000 mg | ORAL_TABLET | Freq: Three times a day (TID) | ORAL | 0 refills | Status: AC

## 2022-10-18 SURGICAL SUPPLY — 38 items
ADPR IRR PORT MULTIBAG TUBE (MISCELLANEOUS)
APL PRP STRL LF DISP 70% ISPRP (MISCELLANEOUS) ×1
BLADE FULL RADIUS 3.5 (BLADE) ×1 IMPLANT
BLADE SHAVER 4.5X7 STR FR (MISCELLANEOUS) IMPLANT
BLADE SURG SZ11 CARB STEEL (BLADE) ×1 IMPLANT
BNDG CMPR 5X4 CHSV STRCH STRL (GAUZE/BANDAGES/DRESSINGS) ×1
BNDG COHESIVE 4X5 TAN STRL LF (GAUZE/BANDAGES/DRESSINGS) ×1 IMPLANT
BNDG ESMARK 6X12 TAN STRL LF (GAUZE/BANDAGES/DRESSINGS) ×1 IMPLANT
CHLORAPREP W/TINT 26 (MISCELLANEOUS) ×1 IMPLANT
COOLER POLAR GLACIER W/PUMP (MISCELLANEOUS) ×1 IMPLANT
COVER LIGHT HANDLE UNIVERSAL (MISCELLANEOUS) ×2 IMPLANT
CUFF TOURN SGL QUICK 30 (TOURNIQUET CUFF) ×1
CUFF TRNQT CYL 30X4X21-28X (TOURNIQUET CUFF) IMPLANT
DRAPE EXTREMITY T 121X128X90 (DISPOSABLE) ×1 IMPLANT
DRAPE IMP U-DRAPE 54X76 (DRAPES) ×1 IMPLANT
GAUZE SPONGE 4X4 12PLY STRL (GAUZE/BANDAGES/DRESSINGS) ×1 IMPLANT
GLOVE SRG 8 PF TXTR STRL LF DI (GLOVE) ×1 IMPLANT
GLOVE SURG ENC MOIS LTX SZ7.5 (GLOVE) ×1 IMPLANT
GLOVE SURG UNDER POLY LF SZ8 (GLOVE) ×1
GOWN STRL REUS W/ TWL LRG LVL3 (GOWN DISPOSABLE) ×1 IMPLANT
GOWN STRL REUS W/TWL LRG LVL3 (GOWN DISPOSABLE) ×1
IV LACTATED RINGER IRRG 3000ML (IV SOLUTION) ×2
IV LR IRRIG 3000ML ARTHROMATIC (IV SOLUTION) ×2 IMPLANT
KIT TURNOVER KIT A (KITS) ×1 IMPLANT
MANIFOLD NEPTUNE II (INSTRUMENTS) ×1 IMPLANT
MAT ABSORB  FLUID 56X50 GRAY (MISCELLANEOUS) ×1
MAT ABSORB FLUID 56X50 GRAY (MISCELLANEOUS) ×1 IMPLANT
PACK ARTHROSCOPY KNEE (MISCELLANEOUS) ×1 IMPLANT
PAD ABD DERMACEA PRESS 5X9 (GAUZE/BANDAGES/DRESSINGS) ×1 IMPLANT
PAD WRAPON POLAR KNEE (MISCELLANEOUS) ×1 IMPLANT
SET Y ADAPTER MULIT-BAG IRRIG (MISCELLANEOUS) IMPLANT
SUT ETHILON 3-0 FS-10 30 BLK (SUTURE) ×1
SUTURE EHLN 3-0 FS-10 30 BLK (SUTURE) ×1 IMPLANT
TOWEL OR 17X26 4PK STRL BLUE (TOWEL DISPOSABLE) ×2 IMPLANT
TUBING INFLOW SET DBFLO PUMP (TUBING) ×1 IMPLANT
TUBING OUTFLOW SET DBLFO PUMP (TUBING) ×1 IMPLANT
WAND WEREWOLF FLOW 90D (MISCELLANEOUS) ×1 IMPLANT
WRAPON POLAR PAD KNEE (MISCELLANEOUS) ×1

## 2022-10-18 NOTE — Anesthesia Postprocedure Evaluation (Signed)
Anesthesia Post Note  Patient: Michele Todd  Procedure(s) Performed: Left knee arthroscopic partial medial and lateral meniscectomy (Left: Knee) Left knee arthroscopic partial medial and lateral meniscectomy (Left: Knee)  Patient location during evaluation: PACU Anesthesia Type: General Level of consciousness: awake and alert Pain management: pain level controlled Vital Signs Assessment: post-procedure vital signs reviewed and stable Respiratory status: spontaneous breathing, nonlabored ventilation, respiratory function stable and patient connected to nasal cannula oxygen Cardiovascular status: blood pressure returned to baseline and stable Postop Assessment: no apparent nausea or vomiting Anesthetic complications: no   No notable events documented.   Last Vitals:  Vitals:   10/18/22 1430 10/18/22 1435  BP: (!) 154/76   Pulse: 79 83  Resp: 13 16  Temp:    SpO2: 100% 99%    Last Pain:  Vitals:   10/18/22 1435  TempSrc:   PainSc: 0-No pain                 Corinda Gubler

## 2022-10-18 NOTE — Anesthesia Procedure Notes (Signed)
Procedure Name: LMA Insertion Date/Time: 10/18/2022 1:28 PM  Performed by: Barbette Hair, CRNAPre-anesthesia Checklist: Patient identified, Emergency Drugs available, Suction available, Patient being monitored and Timeout performed Patient Re-evaluated:Patient Re-evaluated prior to induction Oxygen Delivery Method: Circle system utilized Preoxygenation: Pre-oxygenation with 100% oxygen Induction Type: IV induction LMA: LMA inserted LMA Size: 3.0 Number of attempts: 1 Tube secured with: Tape Dental Injury: Teeth and Oropharynx as per pre-operative assessment

## 2022-10-18 NOTE — Op Note (Signed)
Operative Note    SURGERY DATE: 10/18/2022   PRE-OP DIAGNOSIS:  1. Left medial meniscus tear 2. Left lateral meniscus tear 3. Left early tricompartmental degenerative changes   POST-OP DIAGNOSIS:  1. Left medial meniscus tear 2. Left lateral meniscus tear 3. Left early tricompartmental degenerative changes   PROCEDURES:  1.  Left knee arthroscopy, partial medial AND lateral meniscectomy 2.  Left knee chondroplasty of patellofemoral, medial, and lateral compartments   SURGEON: Rosealee Albee, MD   ANESTHESIA: Gen   ESTIMATED BLOOD LOSS: minimal   TOTAL IV FLUIDS: per anesthesia   INDICATION(S): Michele Todd is a 71 y.o. female with signs and symptoms as well as MRI finding of medial and lateral meniscus tear.  She has failed extensive conservative management and still had persistent symptoms.  Of note, she has had a prior contralateral right knee arthroscopy with partial medial meniscectomy approximately 5 years ago and has done well from that procedure.  After discussion of risks, benefits, and alternatives to surgery, the patient elected to proceed.   OPERATIVE FINDINGS:    Examination under anesthesia: A careful examination under anesthesia was performed.  Passive range of motion was: Hyperextension: 2.  Extension: 0.  Flexion: 130.  Lachman: normal. Pivot Shift: normal.  Posterior drawer: normal.  Varus stability in full extension: normal.  Varus stability in 30 degrees of flexion: normal.  Valgus stability in full extension: normal.  Valgus stability in 30 degrees of flexion: normal.   Intra-operative findings: A thorough arthroscopic examination of the knee was performed.  The findings are: 1. Suprapatellar pouch: Normal 2. Undersurface of median ridge: Grade 1 softening 3. Medial patellar facet: Grade 1 softening 4. Lateral patellar facet: Grade 2-3 degenerative changes 5. Trochlea: Grade 2-3 degenerative changes in the central trochlea 6. Lateral gutter/popliteus  tendon: Normal 7. Hoffa's fat pad: Inflamed 8. Medial gutter/plica: Normal 9. ACL: Normal 10. PCL: Normal 11. Medial meniscus: Complex tear: Full width radial tear at the posterior horn/body junction.  Additional full width horizontal tear of the posterior horn. 12. Medial compartment cartilage: Approximately 6 x 6 mm focal area of grade 2-3  degenerative change to the medial femoral condyle and grade 1 degenerative changes to the tibial plateau 13. Lateral meniscus: Horizontal tear of the anterior horn and meniscus body 14. Lateral compartment cartilage:  Approximately 10 x 6 area of grade 3 degenerative change to the lateral femoral condyle and approximately 10 x 10 mm area of grade 3 degenerative change to the central lateral tibial plateau   OPERATIVE REPORT:     I identified Michele Todd in the pre-operative holding area. I marked the operative knee with my initials. I reviewed the risks and benefits of the proposed surgical intervention and the patient wished to proceed. The patient was transferred to the operative suite and placed in the supine position with all bony prominences padded.  Anesthesia was administered. Appropriate IV antibiotics were administered prior to incision. The extremity was then prepped and draped in standard fashion. A time out was performed confirming the correct extremity, correct patient, and correct procedure.   Arthroscopy portals were marked. Local anesthetic was injected to the planned portal sites. The anterolateral portal was established with an 11 blade.      The arthroscope was placed in the anterolateral portal and then into the suprapatellar pouch. Next, the medial portal was established under needle localization. A diagnostic knee scope was completed with the above findings. The medial meniscus tear was identified.  The MCL was pie-crusted to improve visualization of the posterior horn. The meniscal tear was debrided using an arthroscopic biter and  an oscillating shaver until the meniscus had stable borders.  After resection, there was a full width discontinuity of the meniscus the posterior horn/body junction.  Next, an oscillating shaver was used to debride the anterior horn of the lateral meniscus this involved resection of the superior leaflet.  After resection, the meniscus appeared stable.  Lastly, a chondroplasty was performed of the medial compartment, lateral compartment, and patellofemoral compartment such that there were stable cartilage edges without any loose fragments of cartilage. Arthroscopic fluid was removed from the joint.   The portals were closed with 3-0 Nylon suture. Sterile dressings included Xeroform, 4x4s, Sof-Rol, and Bias wrap. A Polarcare was placed.  The patient was then awakened and taken to the PACU hemodynamically stable without complication.   POSTOPERATIVE PLAN: The patient will be discharged home today once they meet PACU criteria. Aspirin 325 mg daily was prescribed for 2 weeks for DVT prophylaxis.  Physical therapy will start on POD#3-4. Weight-bearing as tolerated. Follow up in 2 weeks per protocol.

## 2022-10-18 NOTE — Discharge Instructions (Signed)
Arthroscopic Knee Surgery - Partial Meniscectomy   Post-Op Instructions   1. Bracing or crutches: Crutches will be provided at the time of discharge from the surgery center if you do not already have them.   2. Ice: You may be provided with a device (Polar Care) that allows you to ice the affected area effectively. Otherwise you can ice manually.    3. Driving:  Plan on not driving for at least two weeks. Please note that you are advised NOT to drive while taking narcotic pain medications as you may be impaired and unsafe to drive.   4. Activity: Ankle pumps several times an hour while awake to prevent blood clots. Weight bearing: as tolerated. Use crutches for as needed (usually ~1 week or less) until pain allows you to ambulate without a limp. Bending and straightening the knee is unlimited. Elevate knee above heart level as much as possible for one week. Avoid standing more than 5 minutes (consecutively) for the first week.  Avoid long distance travel for 2 weeks.  5. Medications:  - You have been provided a prescription for narcotic pain medicine. After surgery, take 1-2 narcotic tablets every 4 hours if needed for severe pain.  - You may take up to 3000mg/day of tylenol (acetaminophen). You can take 1000mg 3x/day. Please check your narcotic. If you have acetaminophen in your narcotic (each tablet will be 325mg), be careful not to exceed a total of 3000mg/day of acetaminophen.  - A prescription for anti-nausea medication will be provided in case the narcotic medicine or anesthesia causes nausea - take 1 tablet every 6 hours only if nauseated.  - Take ibuprofen 800 mg every 8 hours WITH food to reduce post-operative knee swelling. DO NOT STOP IBUPROFEN POST-OP UNTIL INSTRUCTED TO DO SO at first post-op office visit (10-14 days after surgery). However, please discontinue if you have any abdominal discomfort after taking this.  - Take enteric coated aspirin 325 mg once daily for 2 weeks to prevent  blood clots.    6. Bandages: The physical therapist should change the bandages at the first post-op appointment. If needed, the dressing supplies have been provided to you.   7. Physical Therapy: 1-2 times per week for 6 weeks. Therapy typically starts on post operative Day 3 or 4. You have been provided an order for physical therapy. The therapist will provide home exercises.   8. Work: May return to full work usually around 2 weeks after 1st post-operative visit. May do light duty/desk job in approximately 1-2 weeks when off of narcotics, pain is well-controlled, and swelling has decreased. Labor intensive jobs may require 4-6 weeks to return.      9. Post-Op Appointments: Your first post-op appointment will be with Dr. Atharva Mirsky in approximately 2 weeks time.    If you find that they have not been scheduled please call the Orthopaedic Appointment front desk at [phone].  

## 2022-10-18 NOTE — H&P (Signed)
Paper H&P to be scanned into permanent record. H&P reviewed. No significant changes noted.  

## 2022-10-18 NOTE — Anesthesia Preprocedure Evaluation (Signed)
Anesthesia Evaluation  Patient identified by MRN, date of birth, ID band Patient awake    Reviewed: Allergy & Precautions, NPO status , Patient's Chart, lab work & pertinent test results  History of Anesthesia Complications Negative for: history of anesthetic complications  Airway Mallampati: II  TM Distance: >3 FB Neck ROM: Full    Dental no notable dental hx. (+) Teeth Intact   Pulmonary neg sleep apnea, neg COPD, Patient abstained from smoking.Not current smoker, former smoker Frequent sinus drainage and resultant throat clearing/cough. She says today her breathing feels well.   Pulmonary exam normal breath sounds clear to auscultation       Cardiovascular Exercise Tolerance: Good METS(-) hypertension(-) CAD and (-) Past MI negative cardio ROS (-) dysrhythmias  Rhythm:Regular Rate:Normal - Systolic murmurs    Neuro/Psych  Headaches  negative psych ROS   GI/Hepatic ,neg GERD  ,,(+)     (-) substance abuse    Endo/Other  neg diabetes    Renal/GU negative Renal ROS     Musculoskeletal   Abdominal   Peds  Hematology   Anesthesia Other Findings Past Medical History: No date: Headache     Comment:  sinus No date: History of kidney stones No date: Numbness and tingling of right lower extremity     Comment:  R/T pinched nerve No date: Urinary tract infection No date: Vertigo     Comment:  x1, over 15 yrs ago  Reproductive/Obstetrics                              Anesthesia Physical Anesthesia Plan  ASA: 2  Anesthesia Plan: General   Post-op Pain Management:    Induction: Intravenous  PONV Risk Score and Plan: 3 and Ondansetron and Dexamethasone  Airway Management Planned: LMA  Additional Equipment: None  Intra-op Plan:   Post-operative Plan: Extubation in OR  Informed Consent: I have reviewed the patients History and Physical, chart, labs and discussed the procedure  including the risks, benefits and alternatives for the proposed anesthesia with the patient or authorized representative who has indicated his/her understanding and acceptance.     Dental advisory given  Plan Discussed with: CRNA and Surgeon  Anesthesia Plan Comments: (Discussed risks of anesthesia with patient, including PONV, sore throat, lip/dental/eye damage. Rare risks discussed as well, such as cardiorespiratory and neurological sequelae, and allergic reactions. Discussed the role of CRNA in patient's perioperative care. Patient understands.)         Anesthesia Quick Evaluation

## 2022-10-18 NOTE — Transfer of Care (Signed)
Immediate Anesthesia Transfer of Care Note  Patient: Michele Todd  Procedure(s) Performed: Left knee arthroscopic partial medial and lateral meniscectomy (Left: Knee) Left knee arthroscopic partial medial and lateral meniscectomy (Left: Knee)  Patient Location: PACU  Anesthesia Type: General  Level of Consciousness: awake, alert  and patient cooperative  Airway and Oxygen Therapy: Patient Spontanous Breathing and Patient connected to supplemental oxygen  Post-op Assessment: Post-op Vital signs reviewed, Patient's Cardiovascular Status Stable, Respiratory Function Stable, Patent Airway and No signs of Nausea or vomiting  Post-op Vital Signs: Reviewed and stable  Complications: No notable events documented.

## 2022-10-19 ENCOUNTER — Encounter: Payer: Self-pay | Admitting: Orthopedic Surgery

## 2022-10-20 ENCOUNTER — Other Ambulatory Visit: Payer: Self-pay

## 2022-11-12 ENCOUNTER — Ambulatory Visit
Admission: RE | Admit: 2022-11-12 | Discharge: 2022-11-12 | Disposition: A | Discharge status: Home / Self Care | Payer: Medicare Other | Source: Ambulatory Visit | Attending: Student | Admitting: Student

## 2022-11-12 DIAGNOSIS — Z1231 Encounter for screening mammogram for malignant neoplasm of breast: Secondary | ICD-10-CM | POA: Diagnosis present

## 2022-11-15 ENCOUNTER — Other Ambulatory Visit (HOSPITAL_COMMUNITY): Payer: Self-pay | Admitting: Internal Medicine

## 2022-11-15 DIAGNOSIS — K5902 Outlet dysfunction constipation: Secondary | ICD-10-CM

## 2022-11-17 ENCOUNTER — Ambulatory Visit (HOSPITAL_COMMUNITY)
Admission: RE | Admit: 2022-11-17 | Discharge: 2022-11-17 | Disposition: A | Discharge status: Home / Self Care | Payer: Medicare Other | Source: Ambulatory Visit | Attending: Internal Medicine | Admitting: Internal Medicine

## 2022-11-17 DIAGNOSIS — K5902 Outlet dysfunction constipation: Secondary | ICD-10-CM | POA: Insufficient documentation

## 2023-02-15 NOTE — Progress Notes (Signed)
02/16/2023 10:06 AM   Michele Todd May 21, 1951 [id number]  Referring provider: Carren Rang, PA-C [address] [place],  [place] [address]   Urological history: 1. rUTI's -Contributing factors of age, vaginal atrophy, incontinence and constipation -Documented urine cultures over the last year  None -vaginal estrogen cream and cranberry tablets   2. High risk hematuria -non smoker -CTU 2021 - left nephrolithiasis -Cysto 2021 - NED -no reports of gross heme -UA negative for micro heme  3. Nephrolithiasis -CTU 2021 Nonobstructing 5 mm calculus in the lower pole of the left kidney   4. Stress urinary incontinence -s/p anterior and posterior colporrhaphy 10/2021 by Dr. Feliberto Gottron -PVR 51 mL  5. OAB -Contributing factors of age, vaginal atrophy, pelvic surgery and constipation -PVR 51 mL -Myrbetriq 50 mg daily  HPI: Michele Todd is a 72 y.o. female who presents today for 12 month follow up.  She is having 1-7 daytime urinations and no nocturia.  She has a mild urge to urinate.  She wears panty liners daily and does engage in toilet mapping.  Patient denies any modifying or aggravating factors.  Patient denies any gross hematuria, dysuria or suprapubic/flank pain.  Patient denies any fevers, chills, nausea or vomiting.    UA yellow clear, specific gravity less than 1.005, pH 7.0, 0-5 WBCs, 0-2 RBCs, 0-10 epithelial cells.  PVR 51 mL   PMH: Past Medical History:  Diagnosis Date   Headache    sinus   History of kidney stones    Numbness and tingling of right lower extremity    R/T pinched nerve   Urinary tract infection    Vertigo    x1, over 15 yrs ago    Surgical History: Past Surgical History:  Procedure Laterality Date   ABDOMINAL HYSTERECTOMY     ANTERIOR AND POSTERIOR REPAIR N/A 10/26/2021   Procedure: ANTERIOR (CYSTOCELE) AND POSTERIOR REPAIR (RECTOCELE);  Surgeon: Schermerhorn, Ihor Austin, MD;  Location: [facility];   Service: Gynecology;  Laterality: N/A;   CATARACT EXTRACTION, BILATERAL     CHOLECYSTECTOMY     KNEE ARTHROSCOPY WITH LATERAL MENISECTOMY Left 10/18/2022   Procedure: Left knee arthroscopic partial medial and lateral meniscectomy;  Surgeon: Signa Kell, MD;  Location: [facility];  Service: Orthopedics;  Laterality: Left;   KNEE ARTHROSCOPY WITH MEDIAL MENISECTOMY Left 10/18/2022   Procedure: Left knee arthroscopic partial medial and lateral meniscectomy;  Surgeon: Signa Kell, MD;  Location: [facility];  Service: Orthopedics;  Laterality: Left;   KNEE ARTHROSCOPY WITH MENISCAL REPAIR Right 08/23/2017   Procedure: KNEE ARTHROSCOPY PARTIAL MEDIAL AND LATERNAL MENISCECTOMY WITH POSSIBLE CHONDROPLASTY /DEBRIDEMENT OF PATELLAFEMORAL COMPARTMENT;  Surgeon: Signa Kell, MD;  Location: [facility];  Service: Orthopedics;  Laterality: Right;   prolapsed bladder repair N/A 10/2021   TONSILLECTOMY      Home Medications:  Allergies as of 02/16/2023   No Known Allergies      Medication List        Accurate as of February 16, 2023 10:06 AM. If you have any questions, ask your nurse or doctor.          STOP taking these medications    acetaminophen 500 MG tablet Commonly known as: TYLENOL Stopped by: Deisy Ozbun, PA-C   bisacodyl 5 MG EC tablet Commonly known as: DULCOLAX Stopped by: Michiel Cowboy, PA-C   HYDROcodone-acetaminophen 5-325 MG tablet Commonly known as: Norco Stopped by: Lenorris Karger, PA-C   ondansetron 4 MG disintegrating tablet Commonly known  as: ZOFRAN-ODT Stopped by: Michiel Cowboy, PA-C       TAKE these medications    atorvastatin 20 MG tablet Commonly known as: LIPITOR Take 20 mg by mouth at bedtime.   b complex vitamins tablet Take 1 tablet by mouth daily.   cetirizine 10 MG tablet Commonly known as: ZYRTEC Take 10 mg by mouth daily.   estradiol 0.1 MG/GM vaginal cream Commonly known as: ESTRACE Estrogen Cream  Instruction Discard applicator Apply pea sized amount to tip of finger to urethra before bed. Wash hands well after application. Use Monday, Wednesday and Friday   Fish Oil 1200 MG Caps Take 1,200 mg by mouth daily.   hydroxypropyl methylcellulose / hypromellose 2.5 % ophthalmic solution Commonly known as: ISOPTO TEARS / GONIOVISC Place 1 drop into both eyes at bedtime.   magnesium 84 MG ( ) Tbcr SR tablet Commonly known as: MAGTAB Take 84 mg by mouth daily.   MULTIVITAMIN ADULTS PO Take 1 tablet by mouth daily.   Myrbetriq 50 MG Tb24 tablet Generic drug: mirabegron ER Take 1 tablet (50 mg total) by mouth daily.   OSTEO BI-FLEX REGULAR STRENGTH PO Take 2 tablets by mouth daily.   sodium chloride 0.65 % nasal spray Commonly known as: OCEAN Place 1 spray into the nose as needed for congestion.        Allergies: No Known Allergies  Family History: Family History  Problem Relation Age of Onset   Lung cancer Mother    Breast cancer Paternal Aunt        1's   Breast cancer Paternal Aunt        In Late 91's.   Kidney disease Sister     Social History:  reports that she has quit smoking. She has never used smokeless tobacco. She reports current alcohol use. She reports that she does not use drugs.  ROS: Pertinent ROS in HPI  Physical Exam: BP (!) 150/89   Pulse 80   Ht 5\' 7"  (1.702 m)   Wt 186 lb (84.4 kg)   BMI 29.13 kg/m   Constitutional:  Well nourished. Alert and oriented, No acute distress. HEENT: Numa AT, moist mucus membranes.  Trachea midline Cardiovascular: No clubbing, cyanosis, or edema. Respiratory: Normal respiratory effort, no increased work of breathing. Neurologic: Grossly intact, no focal deficits, moving all 4 extremities. Psychiatric: Normal mood and affect.    Laboratory Data: Serum creatinine (10/2022) 0.6 I have reviewed the labs.   Pertinent Imaging:  02/16/23 10:05  Scan Result 51 mL    Assessment & Plan:    1. Urinary  urgency -PVR demonstrates adequate emptying -At goal Myrbetriq 50 mg daily -Continue Myrbetriq 50 mg daily  2. SUI -s/p A & P repair 10/2021  3. Vaginal atrophy -Continue vaginal estrogen cream Monday, Wednesday and Friday nights  4. rUTI's -Asymptomatic at this visit -Continue preventative strategies  5. High risk hematuria -work up 2021 - left renal stone -no reports of gross heme -UA negative for micro heme  6. Left renal stone -follow clinically    Return in about 1 year (around 02/16/2024) for UA, OAB, PVR .  These notes generated with voice recognition software. I apologize for typographical errors.  Cloretta Ned  [facility] [address]  [address] [place], [place] [address] [phone]

## 2023-02-16 ENCOUNTER — Encounter: Payer: Self-pay | Admitting: Urology

## 2023-02-16 ENCOUNTER — Ambulatory Visit (INDEPENDENT_AMBULATORY_CARE_PROVIDER_SITE_OTHER): Payer: Medicare Other | Admitting: Urology

## 2023-02-16 VITALS — BP 150/89 | HR 80 | Ht 67.0 in | Wt 186.0 lb

## 2023-02-16 DIAGNOSIS — R3129 Other microscopic hematuria: Secondary | ICD-10-CM | POA: Diagnosis not present

## 2023-02-16 DIAGNOSIS — R3915 Urgency of urination: Secondary | ICD-10-CM | POA: Diagnosis not present

## 2023-02-16 DIAGNOSIS — N200 Calculus of kidney: Secondary | ICD-10-CM

## 2023-02-16 DIAGNOSIS — N952 Postmenopausal atrophic vaginitis: Secondary | ICD-10-CM

## 2023-02-16 DIAGNOSIS — N393 Stress incontinence (female) (male): Secondary | ICD-10-CM | POA: Diagnosis not present

## 2023-02-16 LAB — URINALYSIS, COMPLETE
Bilirubin, UA: NEGATIVE
Glucose, UA: NEGATIVE
Ketones, UA: NEGATIVE
Leukocytes,UA: NEGATIVE
Nitrite, UA: NEGATIVE
Protein,UA: NEGATIVE
RBC, UA: NEGATIVE
Specific Gravity, UA: <1.005 — ABNORMAL LOW (ref 1.005–1.030)
Urobilinogen, Ur: 0.2 mg/dL (ref 0.2–1.0)
pH, UA: 7 (ref 5.0–7.5)

## 2023-02-16 LAB — MICROSCOPIC EXAMINATION: Bacteria, UA: NONE SEEN

## 2023-02-16 LAB — BLADDER SCAN AMB NON-IMAGING: Scan Result: 51

## 2023-02-18 ENCOUNTER — Ambulatory Visit: Payer: Medicare Other | Admitting: Urology

## 2023-04-11 ENCOUNTER — Ambulatory Visit: Payer: Medicare Other

## 2023-04-11 DIAGNOSIS — Z86010 Personal history of colonic polyps: Secondary | ICD-10-CM | POA: Diagnosis not present

## 2023-04-11 DIAGNOSIS — Z09 Encounter for follow-up examination after completed treatment for conditions other than malignant neoplasm: Secondary | ICD-10-CM | POA: Diagnosis not present

## 2023-04-11 DIAGNOSIS — K640 First degree hemorrhoids: Secondary | ICD-10-CM | POA: Diagnosis not present

## 2023-05-12 ENCOUNTER — Other Ambulatory Visit: Payer: Self-pay | Admitting: Urology

## 2023-10-11 ENCOUNTER — Telehealth: Payer: Self-pay | Admitting: Urology

## 2023-10-11 MED ORDER — MIRABEGRON ER 50 MG PO TB24: 50.0000 mg | ORAL_TABLET | Freq: Every day | ORAL | 3 refills | Status: DC

## 2023-10-11 NOTE — Telephone Encounter (Signed)
rx sent to pharmacy by e-script  

## 2023-10-11 NOTE — Telephone Encounter (Signed)
Patient requested refill for Myrbetriq 50 mg. Her pharmacy is [facility]. She is requesting 90 days  Pharmacy Phone # [phone]

## 2023-11-01 ENCOUNTER — Other Ambulatory Visit: Payer: Self-pay | Admitting: Student

## 2023-11-01 DIAGNOSIS — Z1231 Encounter for screening mammogram for malignant neoplasm of breast: Secondary | ICD-10-CM

## 2023-11-23 ENCOUNTER — Ambulatory Visit
Admission: RE | Admit: 2023-11-23 | Discharge: 2023-11-23 | Disposition: A | Discharge status: Home / Self Care | Payer: Medicare Other | Source: Ambulatory Visit | Attending: Student | Admitting: Student

## 2023-11-23 DIAGNOSIS — Z1231 Encounter for screening mammogram for malignant neoplasm of breast: Secondary | ICD-10-CM | POA: Diagnosis present

## 2024-02-13 NOTE — Progress Notes (Unsigned)
 02/14/2024 1:06 PM   Michele Todd June 08, 1951 [id number]  Referring provider: Carren Rang, PA-C [address] [place],  [place] [address]  Urological history: 1. rUTI's -Contributing factors of age, vaginal atrophy, incontinence and constipation -Documented urine cultures over the last year  None -vaginal estrogen cream and cranberry tablets   2. High risk hematuria -non smoker -CTU 2021 - left nephrolithiasis -Cysto 2021 - NED  3. Nephrolithiasis -CTU 2021 Nonobstructing 5 mm calculus in the lower pole of the left kidney   4. Stress urinary incontinence -s/p anterior and posterior colporrhaphy 10/2021 by Dr. Feliberto Gottron  5. OAB -Contributing factors of age, vaginal atrophy, pelvic surgery and constipation -Myrbetriq 50 mg daily  HPI: Michele Todd is a 73 y.o. female who presents today for 12 month follow up.  Previous records reviewed.     UA ***  PVR ***    PMH: Past Medical History:  Diagnosis Date   Headache    sinus   History of kidney stones    Numbness and tingling of right lower extremity    R/T pinched nerve   Urinary tract infection    Vertigo    x1, over 15 yrs ago    Surgical History: Past Surgical History:  Procedure Laterality Date   ABDOMINAL HYSTERECTOMY     ANTERIOR AND POSTERIOR REPAIR N/A 10/26/2021   Procedure: ANTERIOR (CYSTOCELE) AND POSTERIOR REPAIR (RECTOCELE);  Surgeon: Schermerhorn, Ihor Austin, MD;  Location: [facility];  Service: Gynecology;  Laterality: N/A;   CATARACT EXTRACTION, BILATERAL     CHOLECYSTECTOMY     KNEE ARTHROSCOPY WITH LATERAL MENISECTOMY Left 10/18/2022   Procedure: Left knee arthroscopic partial medial and lateral meniscectomy;  Surgeon: Signa Kell, MD;  Location: [facility];  Service: Orthopedics;  Laterality: Left;   KNEE ARTHROSCOPY WITH MEDIAL MENISECTOMY Left 10/18/2022   Procedure: Left knee arthroscopic partial medial and lateral meniscectomy;  Surgeon:  Signa Kell, MD;  Location: [facility];  Service: Orthopedics;  Laterality: Left;   KNEE ARTHROSCOPY WITH MENISCAL REPAIR Right 08/23/2017   Procedure: KNEE ARTHROSCOPY PARTIAL MEDIAL AND LATERNAL MENISCECTOMY WITH POSSIBLE CHONDROPLASTY /DEBRIDEMENT OF PATELLAFEMORAL COMPARTMENT;  Surgeon: Signa Kell, MD;  Location: [facility];  Service: Orthopedics;  Laterality: Right;   prolapsed bladder repair N/A 10/2021   TONSILLECTOMY      Home Medications:  Allergies as of 02/14/2024   No Known Allergies      Medication List        Accurate as of February 13, 2024  1:06 PM. If you have any questions, ask your nurse or doctor.          atorvastatin 20 MG tablet Commonly known as: LIPITOR Take 20 mg by mouth at bedtime.   b complex vitamins tablet Take 1 tablet by mouth daily.   cetirizine 10 MG tablet Commonly known as: ZYRTEC Take 10 mg by mouth daily.   estradiol 0.1 MG/GM vaginal cream Commonly known as: ESTRACE Estrogen Cream Instruction Discard applicator Apply pea sized amount to tip of finger to urethra before bed. Wash hands well after application. Use Monday, Wednesday and Friday   Fish Oil 1200 MG Caps Take 1,200 mg by mouth daily.   hydroxypropyl methylcellulose / hypromellose 2.5 % ophthalmic solution Commonly known as: ISOPTO TEARS / GONIOVISC Place 1 drop into both eyes at bedtime.   magnesium 84 MG ( ) Tbcr SR tablet Commonly known as: MAGTAB Take 84 mg by mouth daily.   mirabegron ER 50  MG Tb24 tablet Commonly known as: MYRBETRIQ Take 1 tablet (50 mg total) by mouth daily.   MULTIVITAMIN ADULTS PO Take 1 tablet by mouth daily.   OSTEO BI-FLEX REGULAR STRENGTH PO Take 2 tablets by mouth daily.   sodium chloride 0.65 % nasal spray Commonly known as: OCEAN Place 1 spray into the nose as needed for congestion.        Allergies: No Known Allergies  Family History: Family History  Problem Relation Age of Onset   Lung cancer  Mother    Breast cancer Paternal Aunt        44's   Breast cancer Paternal Aunt        In Late 66's.   Kidney disease Sister     Social History:  reports that she has quit smoking. She has never used smokeless tobacco. She reports current alcohol use. She reports that she does not use drugs.  ROS: Pertinent ROS in HPI  Physical Exam: There were no vitals taken for this visit.  Constitutional:  Well nourished. Alert and oriented, No acute distress. HEENT: [place] AT, moist mucus membranes.  Trachea midline, no masses. Cardiovascular: No clubbing, cyanosis, or edema. Respiratory: Normal respiratory effort, no increased work of breathing. GU: No CVA tenderness.  No bladder fullness or masses.  Recession of labia minora, dry, pale vulvar vaginal mucosa and loss of mucosal ridges and folds.  Normal urethral meatus, no lesions, no prolapse, no discharge.   No urethral masses, tenderness and/or tenderness. No bladder fullness, tenderness or masses. *** vagina mucosa, *** estrogen effect, no discharge, no lesions, *** pelvic support, *** cystocele and *** rectocele noted.  No cervical motion tenderness.  Uterus is freely mobile and non-fixed.  No adnexal/parametria masses or tenderness noted.  Anus and perineum are without rashes or lesions.   ***  Neurologic: Grossly intact, no focal deficits, moving all 4 extremities. Psychiatric: Normal mood and affect.    Laboratory Data: CBC w/auto Differential (5 Part) Order: [id number] Component Ref Range & Units 3 mo ago  WBC (White Blood Cell Count) 4.1 - 10.2 10^3/uL 4.8  RBC (Red Blood Cell Count) 4.04 - 5.48 10^6/uL 4.38  Hemoglobin 12.0 - 15.0 gm/dL 11.9  Hematocrit 14.7 - 47.0 % 41.8  MCV (Mean Corpuscular Volume) 80.0 - 100.0 fl 95.4  MCH (Mean Corpuscular Hemoglobin) 27.0 - 31.2 pg 31.3 High   MCHC (Mean Corpuscular Hemoglobin Concentration) 32.0 - 36.0 gm/dL 82.9  Platelet Count 562 - 450 10^3/uL 276  RDW-CV (Red Cell Distribution  Width) 11.6 - 14.8 % 12.5  MPV (Mean Platelet Volume) 9.4 - 12.4 fl 9.9  Neutrophils 1.50 - 7.80 10^3/uL 2.92  Lymphocytes 1.00 - 3.60 10^3/uL 1.27  Monocytes 0.00 - 1.50 10^3/uL 0.39  Eosinophils 0.00 - 0.55 10^3/uL 0.13  Basophils 0.00 - 0.09 10^3/uL 0.05  Neutrophil % 32.0 - 70.0 % 61.3  Lymphocyte % 10.0 - 50.0 % 26.7  Monocyte % 4.0 - 13.0 % 8.2  Eosinophil % 1.0 - 5.0 % 2.7  Basophil% 0.0 - 2.0 % 1.1  Immature Granulocyte % <=0.7 % 0  Immature Granulocyte Count <=0.06 10^3/L 0  Resulting Agency [facility]   Specimen Collected: 10/24/23 08:58   Performed by: [facility] Last Resulted: 10/24/23 13:17  Received From: [facility]  Result Received: 11/01/23 10:32   Comprehensive Metabolic Panel (CMP) Order: [id number] Component Ref Range & Units 3 mo ago  Glucose 70 - 110 mg/dL 87  Sodium  136 - 145 mmol/L 143  Potassium 3.6 - 5.1 mmol/L 4.4  Chloride 97 - 109 mmol/L 108  Carbon Dioxide (CO2) 22.0 - 32.0 mmol/L 29.4  Urea Nitrogen (BUN) 7 - 25 mg/dL 12  Creatinine 0.6 - 1.1 mg/dL 0.7  Glomerular Filtration Rate (eGFR) >60 mL/min/1.73sq m 93  Comment: CKD-EPI (2021) does not include patient's race in the calculation of eGFR.  Monitoring changes of plasma creatinine and eGFR over time is useful for monitoring kidney function.  Interpretive Ranges for eGFR (CKD-EPI 2021):  eGFR:       >60 mL/min/1.73 sq. m - Normal eGFR:       30-59 mL/min/1.73 sq. m - Moderately Decreased eGFR:       15-29 mL/min/1.73 sq. m  - Severely Decreased eGFR:       < 15 mL/min/1.73 sq. m  - Kidney Failure   Note: These eGFR calculations do not apply in acute situations when eGFR is changing rapidly or patients on dialysis.  Calcium 8.7 - 10.3 mg/dL 16.1  AST 8 - 39 U/L 26  ALT 5 - 38 U/L 26  Alk Phos (alkaline Phosphatase) 34 - 104 U/L 72  Albumin 3.5 - 4.8 g/dL 4.4  Bilirubin, Total 0.3 - 1.2 mg/dL 0.6  Protein,  Total 6.1 - 7.9 g/dL 7  A/G Ratio 1.0 - 5.0 gm/dL 1.7  Resulting Agency [facility]   Specimen Collected: 10/24/23 08:58   Performed by: [facility] Last Resulted: 10/24/23 17:42  Received From: [facility]  Result Received: 11/01/23 10:32  I have reviewed the labs.   Pertinent Imaging: ***  Assessment & Plan:    1. Urinary urgency -PVR demonstrates adequate emptying -At goal Myrbetriq 50 mg daily -Continue Myrbetriq 50 mg daily  2. SUI -s/p A & P repair 10/2021  3. Vaginal atrophy -Continue vaginal estrogen cream Monday, Wednesday and Friday nights  4. rUTI's -Asymptomatic at this visit -Continue preventative strategies  5. High risk hematuria -work up 2021 - left renal stone -no reports of gross heme -UA negative for micro heme  6. Left renal stone -follow clinically    No follow-ups on file.  These notes generated with voice recognition software. I apologize for typographical errors.  Cloretta Ned  [facility] [address]  [address] [place], [place] [address] [phone]

## 2024-02-14 ENCOUNTER — Encounter: Payer: Self-pay | Admitting: Urology

## 2024-02-14 ENCOUNTER — Ambulatory Visit (INDEPENDENT_AMBULATORY_CARE_PROVIDER_SITE_OTHER): Payer: Self-pay | Admitting: Urology

## 2024-02-14 VITALS — BP 122/80 | HR 88 | Ht 67.0 in | Wt 186.0 lb

## 2024-02-14 DIAGNOSIS — N390 Urinary tract infection, site not specified: Secondary | ICD-10-CM | POA: Diagnosis not present

## 2024-02-14 DIAGNOSIS — R3915 Urgency of urination: Secondary | ICD-10-CM

## 2024-02-14 DIAGNOSIS — N393 Stress incontinence (female) (male): Secondary | ICD-10-CM | POA: Diagnosis not present

## 2024-02-14 DIAGNOSIS — N952 Postmenopausal atrophic vaginitis: Secondary | ICD-10-CM | POA: Diagnosis not present

## 2024-02-14 DIAGNOSIS — N200 Calculus of kidney: Secondary | ICD-10-CM

## 2024-02-14 DIAGNOSIS — R319 Hematuria, unspecified: Secondary | ICD-10-CM

## 2024-02-14 LAB — URINALYSIS, COMPLETE
Bilirubin, UA: NEGATIVE
Glucose, UA: NEGATIVE
Ketones, UA: NEGATIVE
Nitrite, UA: NEGATIVE
Protein,UA: NEGATIVE
RBC, UA: NEGATIVE
Specific Gravity, UA: 1.015 (ref 1.005–1.030)
Urobilinogen, Ur: 0.2 mg/dL (ref 0.2–1.0)
pH, UA: 8 — ABNORMAL HIGH (ref 5.0–7.5)

## 2024-02-14 LAB — MICROSCOPIC EXAMINATION

## 2024-02-14 LAB — BLADDER SCAN AMB NON-IMAGING: Scan Result: 0

## 2024-03-05 ENCOUNTER — Telehealth: Payer: Self-pay | Admitting: Urology

## 2024-03-05 DIAGNOSIS — N390 Urinary tract infection, site not specified: Secondary | ICD-10-CM

## 2024-03-05 MED ORDER — ESTRADIOL 0.1 MG/GM VA CREA: TOPICAL_CREAM | VAGINAL | 12 refills | Status: AC

## 2024-03-05 NOTE — Telephone Encounter (Signed)
RX sent in and patient advised. 

## 2024-03-05 NOTE — Telephone Encounter (Signed)
 Patient called requesting a medication refill for Estradiol be sent to [facility]. Patient states she is out of refills and it seems to help her. Please advice patient.

## 2024-08-07 ENCOUNTER — Other Ambulatory Visit: Payer: Self-pay

## 2024-08-07 MED ORDER — MIRABEGRON ER 50 MG PO TB24: 50.0000 mg | ORAL_TABLET | Freq: Every day | ORAL | 3 refills | Status: AC

## 2024-11-04 ENCOUNTER — Other Ambulatory Visit: Payer: Self-pay

## 2024-11-04 ENCOUNTER — Encounter: Admission: EM | Disposition: A | Payer: Self-pay | Source: Home / Self Care | Attending: Internal Medicine

## 2024-11-04 ENCOUNTER — Inpatient Hospital Stay: Admitting: Certified Registered"

## 2024-11-04 ENCOUNTER — Emergency Department

## 2024-11-04 ENCOUNTER — Inpatient Hospital Stay

## 2024-11-04 ENCOUNTER — Inpatient Hospital Stay
Admission: EM | Admit: 2024-11-04 | Discharge: 2024-11-06 | DRG: 854 | Disposition: A | Attending: Internal Medicine | Admitting: Internal Medicine

## 2024-11-04 DIAGNOSIS — Q638 Other specified congenital malformations of kidney: Secondary | ICD-10-CM | POA: Diagnosis not present

## 2024-11-04 DIAGNOSIS — N390 Urinary tract infection, site not specified: Principal | ICD-10-CM

## 2024-11-04 DIAGNOSIS — Z87891 Personal history of nicotine dependence: Secondary | ICD-10-CM | POA: Diagnosis not present

## 2024-11-04 DIAGNOSIS — M8580 Other specified disorders of bone density and structure, unspecified site: Secondary | ICD-10-CM | POA: Diagnosis present

## 2024-11-04 DIAGNOSIS — Z9049 Acquired absence of other specified parts of digestive tract: Secondary | ICD-10-CM | POA: Diagnosis not present

## 2024-11-04 DIAGNOSIS — Z803 Family history of malignant neoplasm of breast: Secondary | ICD-10-CM | POA: Diagnosis not present

## 2024-11-04 DIAGNOSIS — Z90710 Acquired absence of both cervix and uterus: Secondary | ICD-10-CM | POA: Diagnosis not present

## 2024-11-04 DIAGNOSIS — Z9842 Cataract extraction status, left eye: Secondary | ICD-10-CM | POA: Diagnosis not present

## 2024-11-04 DIAGNOSIS — N12 Tubulo-interstitial nephritis, not specified as acute or chronic: Secondary | ICD-10-CM | POA: Diagnosis not present

## 2024-11-04 DIAGNOSIS — N201 Calculus of ureter: Secondary | ICD-10-CM

## 2024-11-04 DIAGNOSIS — Z87442 Personal history of urinary calculi: Secondary | ICD-10-CM

## 2024-11-04 DIAGNOSIS — Z87440 Personal history of urinary (tract) infections: Secondary | ICD-10-CM | POA: Diagnosis not present

## 2024-11-04 DIAGNOSIS — N136 Pyonephrosis: Secondary | ICD-10-CM | POA: Diagnosis present

## 2024-11-04 DIAGNOSIS — Z8419 Family history of other disorders of kidney and ureter: Secondary | ICD-10-CM | POA: Diagnosis not present

## 2024-11-04 DIAGNOSIS — A419 Sepsis, unspecified organism: Secondary | ICD-10-CM | POA: Diagnosis present

## 2024-11-04 DIAGNOSIS — R911 Solitary pulmonary nodule: Secondary | ICD-10-CM | POA: Diagnosis present

## 2024-11-04 DIAGNOSIS — Z9841 Cataract extraction status, right eye: Secondary | ICD-10-CM | POA: Diagnosis not present

## 2024-11-04 DIAGNOSIS — N202 Calculus of kidney with calculus of ureter: Secondary | ICD-10-CM | POA: Diagnosis present

## 2024-11-04 DIAGNOSIS — R10A2 Flank pain, left side: Secondary | ICD-10-CM

## 2024-11-04 DIAGNOSIS — E785 Hyperlipidemia, unspecified: Secondary | ICD-10-CM | POA: Diagnosis present

## 2024-11-04 DIAGNOSIS — Z79899 Other long term (current) drug therapy: Secondary | ICD-10-CM | POA: Diagnosis not present

## 2024-11-04 DIAGNOSIS — Z801 Family history of malignant neoplasm of trachea, bronchus and lung: Secondary | ICD-10-CM | POA: Diagnosis not present

## 2024-11-04 HISTORY — PX: CYSTOSCOPY W/ URETERAL STENT PLACEMENT: SHX1429

## 2024-11-04 LAB — URINALYSIS, ROUTINE W REFLEX MICROSCOPIC
Bilirubin Urine: NEGATIVE
Glucose, UA: NEGATIVE mg/dL
Hgb urine dipstick: NEGATIVE
Ketones, ur: 5 mg/dL — AB
Nitrite: POSITIVE — AB
Protein, ur: NEGATIVE mg/dL
Specific Gravity, Urine: 1.017 (ref 1.005–1.030)
pH: 6 (ref 5.0–8.0)

## 2024-11-04 LAB — COMPREHENSIVE METABOLIC PANEL WITH GFR
ALT: 31 U/L (ref 0–44)
AST: 29 U/L (ref 15–41)
Albumin: 4.5 g/dL (ref 3.5–5.0)
Alkaline Phosphatase: 67 U/L (ref 38–126)
Anion gap: 9 (ref 5–15)
BUN: 18 mg/dL (ref 8–23)
CO2: 27 mmol/L (ref 22–32)
Calcium: 10.8 mg/dL — ABNORMAL HIGH (ref 8.9–10.3)
Chloride: 102 mmol/L (ref 98–111)
Creatinine, Ser: 0.76 mg/dL (ref 0.44–1.00)
GFR, Estimated: >60 mL/min (ref >60)
Glucose, Bld: 107 mg/dL — ABNORMAL HIGH (ref 70–99)
Potassium: 4.7 mmol/L (ref 3.5–5.1)
Sodium: 138 mmol/L (ref 135–145)
Total Bilirubin: 0.6 mg/dL (ref 0.0–1.2)
Total Protein: 7.4 g/dL (ref 6.5–8.1)

## 2024-11-04 LAB — CBC
HCT: 42.7 % (ref 36.0–46.0)
Hemoglobin: 14 g/dL (ref 12.0–15.0)
MCH: 30.8 pg (ref 26.0–34.0)
MCHC: 32.8 g/dL (ref 30.0–36.0)
MCV: 94.1 fL (ref 80.0–100.0)
Platelets: 285 K/uL (ref 150–400)
RBC: 4.54 MIL/uL (ref 3.87–5.11)
RDW: 12.6 % (ref 11.5–15.5)
WBC: 9.3 K/uL (ref 4.0–10.5)
nRBC: 0 % (ref 0.0–0.2)

## 2024-11-04 LAB — LIPASE, BLOOD: Lipase: 25 U/L (ref 11–51)

## 2024-11-04 SURGERY — CYSTOSCOPY, WITH RETROGRADE PYELOGRAM AND URETERAL STENT INSERTION
Anesthesia: General | Laterality: Left

## 2024-11-04 MED ORDER — SODIUM CHLORIDE 0.9 % IR SOLN
Status: DC | PRN
  Administered 2024-11-04: 3000 mL

## 2024-11-04 MED ORDER — ALBUTEROL SULFATE (2.5 MG/3ML) 0.083% IN NEBU: 2.5000 mg | INHALATION_SOLUTION | RESPIRATORY_TRACT | Status: DC | PRN

## 2024-11-04 MED ORDER — SODIUM CHLORIDE 0.9 % IV BOLUS
500.0000 mL | Freq: Once | INTRAVENOUS | Status: AC
  Administered 2024-11-04: 500 mL via INTRAVENOUS

## 2024-11-04 MED ORDER — DROPERIDOL 2.5 MG/ML IJ SOLN: 0.6250 mg | Freq: Once | INTRAMUSCULAR | Status: DC | PRN

## 2024-11-04 MED ORDER — MAGNESIUM CHLORIDE 64 MG PO TBEC
1.0000 | DELAYED_RELEASE_TABLET | Freq: Every day | ORAL | Status: DC
  Administered 2024-11-04 – 2024-11-05 (×2): 64 mg via ORAL
  Filled 2024-11-04 (×2): qty 1

## 2024-11-04 MED ORDER — ACETAMINOPHEN 10 MG/ML IV SOLN
INTRAVENOUS | Status: DC | PRN
  Administered 2024-11-04: 1000 mg via INTRAVENOUS

## 2024-11-04 MED ORDER — ONDANSETRON HCL 4 MG/2ML IJ SOLN: 4.0000 mg | Freq: Four times a day (QID) | INTRAMUSCULAR | Status: DC | PRN

## 2024-11-04 MED ORDER — MIRABEGRON ER 50 MG PO TB24
50.0000 mg | ORAL_TABLET | Freq: Every day | ORAL | Status: DC
  Administered 2024-11-04 – 2024-11-06 (×3): 50 mg via ORAL
  Filled 2024-11-04 (×3): qty 1

## 2024-11-04 MED ORDER — SODIUM CHLORIDE 0.9 % IV SOLN
1.0000 g | INTRAVENOUS | Status: DC
  Administered 2024-11-05: 1 g via INTRAVENOUS
  Filled 2024-11-04: qty 10

## 2024-11-04 MED ORDER — SENNOSIDES-DOCUSATE SODIUM 8.6-50 MG PO TABS: 1.0000 | ORAL_TABLET | Freq: Every evening | ORAL | Status: DC | PRN

## 2024-11-04 MED ORDER — LORATADINE 10 MG PO TABS
10.0000 mg | ORAL_TABLET | Freq: Every day | ORAL | Status: DC
  Administered 2024-11-04 – 2024-11-06 (×3): 10 mg via ORAL
  Filled 2024-11-04 (×3): qty 1

## 2024-11-04 MED ORDER — SODIUM CHLORIDE 0.9 % IV SOLN: INTRAVENOUS | Status: DC

## 2024-11-04 MED ORDER — ONDANSETRON HCL 4 MG/2ML IJ SOLN
INTRAMUSCULAR | Status: DC | PRN
  Administered 2024-11-04: 4 mg via INTRAVENOUS

## 2024-11-04 MED ORDER — ONDANSETRON HCL 4 MG PO TABS: 4.0000 mg | ORAL_TABLET | Freq: Four times a day (QID) | ORAL | Status: DC | PRN

## 2024-11-04 MED ORDER — HYDRALAZINE HCL 20 MG/ML IJ SOLN: 5.0000 mg | Freq: Four times a day (QID) | INTRAMUSCULAR | Status: DC | PRN

## 2024-11-04 MED ORDER — CHLORHEXIDINE GLUCONATE CLOTH 2 % EX PADS: 6.0000 | MEDICATED_PAD | Freq: Every day | CUTANEOUS | Status: DC

## 2024-11-04 MED ORDER — ONDANSETRON HCL 4 MG/2ML IJ SOLN
4.0000 mg | Freq: Once | INTRAMUSCULAR | Status: AC
  Administered 2024-11-04: 4 mg via INTRAVENOUS
  Filled 2024-11-04: qty 2

## 2024-11-04 MED ORDER — FENTANYL CITRATE (PF) 100 MCG/2ML IJ SOLN: 25.0000 ug | INTRAMUSCULAR | Status: DC | PRN

## 2024-11-04 MED ORDER — ACETAMINOPHEN 325 MG PO TABS: 650.0000 mg | ORAL_TABLET | Freq: Four times a day (QID) | ORAL | Status: DC | PRN

## 2024-11-04 MED ORDER — KETOROLAC TROMETHAMINE 30 MG/ML IJ SOLN
INTRAMUSCULAR | Status: DC | PRN
  Administered 2024-11-04: 30 mg via INTRAVENOUS

## 2024-11-04 MED ORDER — FENTANYL CITRATE (PF) 100 MCG/2ML IJ SOLN
INTRAMUSCULAR | Status: DC | PRN
  Administered 2024-11-04: 50 ug via INTRAVENOUS

## 2024-11-04 MED ORDER — ACETAMINOPHEN 10 MG/ML IV SOLN
INTRAVENOUS | Status: AC
  Filled 2024-11-04: qty 100

## 2024-11-04 MED ORDER — SUCCINYLCHOLINE CHLORIDE 200 MG/10ML IV SOSY
PREFILLED_SYRINGE | INTRAVENOUS | Status: DC | PRN
  Administered 2024-11-04: 100 mg via INTRAVENOUS

## 2024-11-04 MED ORDER — LACTATED RINGERS IV SOLN: INTRAVENOUS | Status: DC | PRN

## 2024-11-04 MED ORDER — ACETAMINOPHEN 650 MG RE SUPP: 650.0000 mg | Freq: Four times a day (QID) | RECTAL | Status: DC | PRN

## 2024-11-04 MED ORDER — PROPOFOL 10 MG/ML IV BOLUS
INTRAVENOUS | Status: AC
  Filled 2024-11-04: qty 20

## 2024-11-04 MED ORDER — SODIUM CHLORIDE 0.9 % IV SOLN
2.0000 g | Freq: Once | INTRAVENOUS | Status: AC
  Administered 2024-11-04: 2 g via INTRAVENOUS
  Filled 2024-11-04: qty 20

## 2024-11-04 MED ORDER — DEXAMETHASONE SOD PHOSPHATE PF 10 MG/ML IJ SOLN
INTRAMUSCULAR | Status: DC | PRN
  Administered 2024-11-04: 10 mg via INTRAVENOUS

## 2024-11-04 MED ORDER — TAMSULOSIN HCL 0.4 MG PO CAPS
0.4000 mg | ORAL_CAPSULE | Freq: Every day | ORAL | Status: DC
  Administered 2024-11-04 – 2024-11-06 (×3): 0.4 mg via ORAL
  Filled 2024-11-04 (×3): qty 1

## 2024-11-04 MED ORDER — ATORVASTATIN CALCIUM 20 MG PO TABS
20.0000 mg | ORAL_TABLET | Freq: Every day | ORAL | Status: DC
  Administered 2024-11-04 – 2024-11-05 (×2): 20 mg via ORAL
  Filled 2024-11-04 (×2): qty 1

## 2024-11-04 MED ORDER — PROPOFOL 10 MG/ML IV BOLUS
INTRAVENOUS | Status: DC | PRN
  Administered 2024-11-04: 100 mg via INTRAVENOUS

## 2024-11-04 MED ORDER — LIDOCAINE HCL (CARDIAC) PF 100 MG/5ML IV SOSY
PREFILLED_SYRINGE | INTRAVENOUS | Status: DC | PRN
  Administered 2024-11-04: 60 mg via INTRAVENOUS

## 2024-11-04 MED ORDER — FENTANYL CITRATE (PF) 100 MCG/2ML IJ SOLN
INTRAMUSCULAR | Status: AC
  Filled 2024-11-04: qty 2

## 2024-11-04 MED ORDER — KETOROLAC TROMETHAMINE 30 MG/ML IJ SOLN: 30.0000 mg | Freq: Once | INTRAMUSCULAR | Status: DC

## 2024-11-04 MED ORDER — IOHEXOL 180 MG/ML  SOLN
INTRAMUSCULAR | Status: DC | PRN
  Administered 2024-11-04: 40 mL

## 2024-11-04 MED ORDER — MORPHINE SULFATE (PF) 4 MG/ML IV SOLN
4.0000 mg | Freq: Once | INTRAVENOUS | Status: AC
  Administered 2024-11-04: 4 mg via INTRAVENOUS
  Filled 2024-11-04: qty 1

## 2024-11-04 SURGICAL SUPPLY — 21 items
BAG DRAIN SIEMENS DORNER NS (MISCELLANEOUS) ×2 IMPLANT
BAG URINE DRAIN 2000ML AR STRL (UROLOGICAL SUPPLIES) IMPLANT
BRUSH SCRUB EZ 4% CHG (MISCELLANEOUS) ×1 IMPLANT
CATH URETL OPEN 5X70 (CATHETERS) ×1 IMPLANT
CATH URTH 16FR FL 2W BLN LF (CATHETERS) IMPLANT
DRAPE UTILITY 15X26 TOWEL STRL (DRAPES) ×1 IMPLANT
GAUZE 4X4 16PLY ~~LOC~~+RFID DBL (SPONGE) ×2 IMPLANT
GOWN STRL REUS W/ TWL LRG LVL3 (GOWN DISPOSABLE) ×1 IMPLANT
GUIDEWIRE ANGLED .035 180CM (WIRE) IMPLANT
GUIDEWIRE STR DUAL SENSOR (WIRE) ×1 IMPLANT
KIT TURNOVER CYSTO (KITS) ×1 IMPLANT
MANIFOLD NEPTUNE II (INSTRUMENTS) ×1 IMPLANT
PACK CYSTO AR (MISCELLANEOUS) ×1 IMPLANT
SET CYSTO IRRIGATION (SET/KITS/TRAYS/PACK) ×1 IMPLANT
SOL .9 NS 3000ML IRR UROMATIC (IV SOLUTION) ×1 IMPLANT
SOLN STERILE WATER 500 ML (IV SOLUTION) ×1 IMPLANT
SOLN STERILE WATER BTL 1000 ML (IV SOLUTION) ×1 IMPLANT
STENT URET 6FRX24 CONTOUR (STENTS) IMPLANT
STENT URET 6FRX26 CONTOUR (STENTS) IMPLANT
SURGILUBE 2OZ TUBE FLIPTOP (MISCELLANEOUS) ×1 IMPLANT
TRAP FLUID SMOKE EVACUATOR (MISCELLANEOUS) ×1 IMPLANT

## 2024-11-04 NOTE — ED Triage Notes (Signed)
 Pt comes with left sided pain, back pain and vomiting. Pt states this started today around 2 but she did vomit yesterday. Pt denies any urinary symptoms.

## 2024-11-04 NOTE — H&P (Signed)
 History and Physical    Patient: Michele Todd FMW:[id number] DOB: Nov 26, 1951 DOA: 11/04/2024 DOS: the patient was seen and examined on 11/04/2024 PCP: Franchot Houston, PA-C  Patient coming from: Home  Chief Complaint:  Chief Complaint  Patient presents with   Abdominal Pain   Back Pain   HPI: Michele Todd is a 73 y.o. female with medical history significant of recurrent urinary tract infection status post cystoscopy in 2021 with no significant findings, history of nonobstructing kidney stone, overactive bladder on Myrbetriq , osteoarthritis, osteopenia on DEXA AND status post cholecystectomy.  She presents to the emergency room with complaints of left flank pain over a few days duration.  No known aggravating or relieving factors.  Symptoms worsened during the course of day, has decided to come to emergency room to be further evaluated.  Symptoms are associated with nausea and vomiting.  In the ED, workup revealed findings suggestive UTI with associated hematuria.  CT scan of the abdomen and pelvis revealed a 6 mm proximal left ureteral calculus. Patient was seen and assessed by urology in the ED.  Patient was recommended cystoscopy with left retrograde pyelogram and ureteral stent placement.  Review of Systems: As mentioned in the history of present illness. All other systems reviewed and are negative. Past Medical History:  Diagnosis Date   Headache    sinus   History of kidney stones    Numbness and tingling of right lower extremity    R/T pinched nerve   Urinary tract infection    Vertigo    x1, over 15 yrs ago   Past Surgical History:  Procedure Laterality Date   ABDOMINAL HYSTERECTOMY     ANTERIOR AND POSTERIOR REPAIR N/A 10/26/2021   Procedure: ANTERIOR (CYSTOCELE) AND POSTERIOR REPAIR (RECTOCELE);  Surgeon: Schermerhorn, Debby PARAS, MD;  Location: [facility];  Service: Gynecology;  Laterality: N/A;   CATARACT EXTRACTION, BILATERAL     CHOLECYSTECTOMY     KNEE  ARTHROSCOPY WITH LATERAL MENISECTOMY Left 10/18/2022   Procedure: Left knee arthroscopic partial medial and lateral meniscectomy;  Surgeon: Tobie Priest, MD;  Location: [facility];  Service: Orthopedics;  Laterality: Left;   KNEE ARTHROSCOPY WITH MEDIAL MENISECTOMY Left 10/18/2022   Procedure: Left knee arthroscopic partial medial and lateral meniscectomy;  Surgeon: Tobie Priest, MD;  Location: [facility];  Service: Orthopedics;  Laterality: Left;   KNEE ARTHROSCOPY WITH MENISCAL REPAIR Right 08/23/2017   Procedure: KNEE ARTHROSCOPY PARTIAL MEDIAL AND LATERNAL MENISCECTOMY WITH POSSIBLE CHONDROPLASTY /DEBRIDEMENT OF PATELLAFEMORAL COMPARTMENT;  Surgeon: Tobie Priest, MD;  Location: [facility];  Service: Orthopedics;  Laterality: Right;   prolapsed bladder repair N/A 10/2021   TONSILLECTOMY     Social History:  reports that she has quit smoking. She has never used smokeless tobacco. She reports current alcohol use. She reports that she does not use drugs.  No Known Allergies  Family History  Problem Relation Age of Onset   Lung cancer Mother    Breast cancer Paternal Aunt        64's   Breast cancer Paternal Aunt        In Late 51's.   Kidney disease Sister     Prior to Admission medications   Medication Sig Start Date End Date Taking? Authorizing Provider  atorvastatin  (LIPITOR) 20 MG tablet Take 20 mg by mouth at bedtime.   Yes [provider]  b complex vitamins tablet Take 1 tablet by mouth daily.   Yes [provider]  cetirizine (ZYRTEC)  10 MG tablet Take 10 mg by mouth daily.   Yes [provider]  estradiol  (ESTRACE ) 0.1 MG/GM vaginal cream Estrogen Cream Instruction Discard applicator Apply pea sized amount to tip of finger to urethra before bed. Wash hands well after application. Use Monday, Wednesday and Friday 03/05/24  Yes McGowan, Clotilda A, PA-C  Glucosamine-Chondroitin (OSTEO BI-FLEX REGULAR STRENGTH PO) Take 2 tablets  by mouth daily.   Yes [provider]  hydroxypropyl methylcellulose / hypromellose (ISOPTO TEARS / GONIOVISC) 2.5 % ophthalmic solution Place 1 drop into both eyes at bedtime.   Yes [provider]  magnesium (MAGTAB) 84 MG ( ) TBCR SR tablet Take 84 mg by mouth daily.   Yes [provider]  mirabegron  ER (MYRBETRIQ ) 50 MG TB24 tablet Take 1 tablet (50 mg total) by mouth daily. 08/07/24  Yes McGowan, Clotilda A, PA-C  Multiple Vitamins-Minerals (MULTIVITAMIN ADULTS PO) Take 1 tablet by mouth daily.   Yes [provider]  Omega-3 Fatty Acids (FISH OIL) 1200 MG CAPS Take 1,200 mg by mouth daily.   Yes [provider]    Physical Exam: Vitals:   11/04/24 1519 11/04/24 1521 11/04/24 1730 11/04/24 1802  BP:  (!) 150/86    Pulse:  84 99 (!) 113  Resp:  18    Temp:  97.6 F (36.4 C)  99 F (37.2 C)  TempSrc:    Oral  SpO2:  100% 100%   Weight: 84.8 kg     Height: 5' 7 (1.702 m)      General: Patient is alert oriented x 3. HEENT: Head atraumatic.  Pupils equal round reactive to light and accommodation.  Neck: Supple with no JVD Chest: Clinically clear bilaterally Cardiovascular: Regular S1-S2 no murmur Abdomen: Mild left flank tenderness Extremities without any pedal edema CNS shows no focal deficits Data Reviewed: Labs reviewed shows sodium of 138, potassium 4.7, chloride 102, bicarb 27, glucose 107, BUN 18, creatinine 0.76 calcium 10.8, AST 29, ALT 31, WBC 9.3, hemoglobin 14, hematocrit 42.7, platelet count 25,  CT scan with following findings: 1. 6 mm calculus in the proximal left ureter with mild obstructive uropathy. 2. Small focus of air in the bladder may be related to recent instrumentation or infection. 3. Right solid pulmonary nodule measuring 2 mm    Assessment and Plan: 73 year old female with history of overactive bladder, recurrent urinary tract infection who presents with left flank pain secondary to ureteric colic.  6 mm  stone seen on CT scan.  Urology has recommended emergent cystoscopy with retrograde Polygram and stent placement this evening.  #1.  Ureteric colic secondary to 6 mm obstructing stone.  Seen and evaluated by urology with plans for cystoscopy, retrograde pyelogram and stent placement.  Procedure scheduled for this evening.  Continue with IV fluids.  Flomax was ordered.  This may however be discontinued considering no interventions being planned.  #2.  Acute urinary tract infection in the context of ureteric stone.  Urine cultures has been obtained and pending.  Blood cultures also ordered and pending.  Patient will be treated with IV antibiotics with Rocephin.  #3.  Incidental finding of 2 mm pulmonary nodule.  Patient was low risk.  Will defer to primary care for follow-up.    Advance Care Planning:   Code Status: Full Code   Consults: Urology consult  Family Communication: Husband is at bedside and was updated regarding plan of care  Severity of Illness: The appropriate patient status for this patient is INPATIENT. Inpatient  status is judged to be reasonable and necessary in order to provide the required intensity of service to ensure the patient's safety. The patient's presenting symptoms, physical exam findings, and initial radiographic and laboratory data in the context of their chronic comorbidities is felt to place them at high risk for further clinical deterioration. Furthermore, it is not anticipated that the patient will be medically stable for discharge from the hospital within 2 midnights of admission.   * I certify that at the point of admission it is my clinical judgment that the patient will require inpatient hospital care spanning beyond 2 midnights from the point of admission due to high intensity of service, high risk for further deterioration and high frequency of surveillance required.*  Author: Maude MARLA Dart, MD 11/04/2024 7:10 PM  For on call review [email or web address].

## 2024-11-04 NOTE — Op Note (Signed)
 Operative Note  Preoperative diagnosis:  1.  Left ureteral calculus with urinary tract infection  Postoperative diagnosis: 1.  Left ureteral calculus with urinary tract infection  Procedure(s): 1.  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  Left lower pole moiety renal pelvis culture  Drains/Catheters: 1.  Tandem stents placed, 6 x 26 stent in the lower pole moiety and 6 x 24 stent in the upper pole moiety  Intraoperative findings: 1.  Normal urethra and bladder 2.  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  It appeared that the lower pole moiety was the most likely obstructed moeity with mild hydronephrosis and no significant hydronephrosis of the upper pole moiety but given her sepsis I elected to place tandem stents into each moeity  Indication: 73 year old female with an obstructing left ureteral calculus and urinary tract infection presents for the previously mentioned operation.  Description of procedure:  The patient was identified and consent was obtained.  The patient was taken to the operating room and placed in the supine position.  The patient was placed under general anesthesia.  Perioperative antibiotics were administered.  The patient was placed in dorsal lithotomy.  Patient was prepped and draped in a standard sterile fashion and a timeout was performed.  A 21 French rigid cystoscope was advanced into the urethra and into the bladder with complete cystoscopy was performed with findings noted above.  Left ureteral orifice was intubated with an open-ended ureteral catheter and a retrograde pyelogram was performed with findings noted above.  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  It appeared that the lower pole moiety was the most likely obstructed moiety but I elected to stent both.  I advanced a sensor wire up the ureter and this went into the upper pole moiety and removed that.  I used an angled Glidewire and navigated that into the lower pole moiety.  I advanced an open-ended ureteral catheter over that wire and up to the renal pelvis and remove the wire.  I collected a culture specimen.  I readvanced the wire into the kidney and withdrew the open-ended ureteral catheter.  I advanced a second sensor wire up the ureter and this advanced up the upper pole moiety.  I withdrew the scope.  I backloaded the lower pole moiety wire onto the rigid cystoscope and advanced that into the bladder followed by routine placement of a 6 x 26 double-J ureteral stent.  I then backloaded the other wire onto the rigid cystoscope and advanced that into the bladder followed by routine placement of a 6 x 24 double-J ureteral stent.  Fluoroscopy confirmed proximal placement of each stent and direct visualization confirmed a dual coil in the bladder.  I withdrew the scope and placed a Foley catheter.  This concluded the operation.  Patient tolerated the procedure well and stable postoperatively.  Plan: She will continue IV antibiotics until culture speciation.  She will need definitive management of her stone with ureteroscopy in a couple of weeks or so.

## 2024-11-04 NOTE — ED Provider Notes (Signed)
 [facility] Emergency Department Provider Note     Event Date/Time   First MD Initiated Contact with Patient 11/04/24 1601     (approximate)   History   Abdominal Pain and Back Pain   HPI  Michele Todd is a 73 y.o. female with a chart noted history of nephrolithiasis, HLD, and osteopenia, presents to the ED endorsing left-sided flank pain with associated vomiting.  Patient reports onset of symptoms today around 2 PM.  She also reports an episode of nonbloody, nonbilious emesis yesterday.  She denies any frank urinary symptoms including hematuria, dysuria, or urinary retention.  No reports of any fevers, chills, sweats patient any bilateral or bowel changes.  Chart reviewed reveals a CT renal stone study performed in 2021, which confirmed a 5 mm nonobstructive left lower pole renal calculi.   Physical Exam   Triage Vital Signs: ED Triage Vitals  Encounter Vitals Group     BP 11/04/24 1521 (!) 150/86     Girls Systolic BP Percentile --      Girls Diastolic BP Percentile --      Boys Systolic BP Percentile --      Boys Diastolic BP Percentile --      Pulse Rate 11/04/24 1521 84     Resp 11/04/24 1521 18     Temp 11/04/24 1521 97.6 F (36.4 C)     Temp src --      SpO2 11/04/24 1521 100 %     Weight 11/04/24 1519 187 lb (84.8 kg)     Height 11/04/24 1519 5' 7 (1.702 m)     Head Circumference --      Peak Flow --      Pain Score 11/04/24 1519 10     Pain Loc --      Pain Education --      Exclude from Growth Chart --     Most recent vital signs: Vitals:   11/04/24 1730 11/04/24 1802  BP:    Pulse: 99 (!) 113  Resp:    Temp:  99 F (37.2 C)  SpO2: 100%     General Awake, no distress. NAD HEENT NCAT. PERRL. EOMI. No rhinorrhea. Mucous membranes are moist.  CV:  Good peripheral perfusion. RRR RESP:  Normal effort. CTA ABD:  No distention.  Soft and nontender.  Left flank pain on exam. MSK:  AROM NEURO: CN II-XII grossly  intact   ED Results / Procedures / Treatments   Labs (all labs ordered are listed, but only abnormal results are displayed) Labs Reviewed  COMPREHENSIVE METABOLIC PANEL WITH GFR - Abnormal; Notable for the following components:      Result Value   Glucose, Bld 107 (*)    Calcium 10.8 (*)    All other components within normal limits  URINALYSIS, ROUTINE W REFLEX MICROSCOPIC - Abnormal; Notable for the following components:   Color, Urine AMBER (*)    APPearance CLOUDY (*)    Ketones, ur 5 (*)    Nitrite POSITIVE (*)    Leukocytes,Ua SMALL (*)    Bacteria, UA MANY (*)    All other components within normal limits  URINE CULTURE  LIPASE, BLOOD  CBC    EKG   RADIOLOGY  I personally viewed and evaluated these images as part of my medical decision making, as well as reviewing the written report by the radiologist.  ED Provider Interpretation: 6 mL left proximal ureter calculus with evidence of hydronephrosis and perinephric  stranding  CT Renal Stone Study Result Date: 11/04/2024 CLINICAL DATA:  Abdominal and flank pain, left-sided. EXAM: CT ABDOMEN AND PELVIS WITHOUT CONTRAST TECHNIQUE: Multidetector CT imaging of the abdomen and pelvis was performed following the standard protocol without IV contrast. RADIATION DOSE REDUCTION: This exam was performed according to the departmental dose-optimization program which includes automated exposure control, adjustment of the mA and/or kV according to patient size and/or use of iterative reconstruction technique. COMPARISON:  CT abdomen and pelvis 08/20/2019 FINDINGS: Lower chest: There is a 2 mm nodular density in the right lower lobe. The lung bases are otherwise clear. Hepatobiliary: No focal liver abnormality is seen. Status post cholecystectomy. No biliary dilatation. Pancreas: Pancreatic head calcifications are present. No acute inflammation of the pancreas. Spleen: Normal in size without focal abnormality. Adrenals/Urinary Tract: There is  a 6 mm calculus in the proximal left ureter. There is mild left-sided hydroureteronephrosis and mild left perinephric fat stranding. No additional renal calculi are seen. The adrenal glands and right kidney are within normal limits. The bladder is decompressed. There is a small focus of air in the bladder. Stomach/Bowel: There is a small hiatal hernia. Stomach is within normal limits. Appendix appears normal. No evidence of bowel wall thickening, distention, or inflammatory changes. There is a large amount of stool throughout the colon. Vascular/Lymphatic: Aortic atherosclerosis. No enlarged abdominal or pelvic lymph nodes. Reproductive: Status post hysterectomy. No adnexal masses. Other: No abdominal wall hernia or abnormality. No abdominopelvic ascites. Musculoskeletal: No acute or significant osseous findings. IMPRESSION: 1. 6 mm calculus in the proximal left ureter with mild obstructive uropathy. 2. Small focus of air in the bladder may be related to recent instrumentation or infection. 3. Right solid pulmonary nodule measuring 2 mm. No follow-up needed if patient is low-risk.This recommendation follows the consensus statement: Guidelines for Management of Incidental Pulmonary Nodules Detected on CT Images: From the Fleischner Society 2017; Radiology 2017; 284:228-243. Aortic Atherosclerosis (ICD10-I70.0). Electronically Signed   By: Greig Pique M.D.   On: 11/04/2024 17:41    PROCEDURES:  Critical Care performed: No  Procedures   MEDICATIONS ORDERED IN ED: Medications  cefTRIAXone (ROCEPHIN) 2 g in sodium chloride  0.9 % 100 mL IVPB (has no administration in time range)  sodium chloride  0.9 % bolus 500 mL (500 mLs Intravenous New Bag/Given 11/04/24 1659)  ondansetron  (ZOFRAN ) injection 4 mg (4 mg Intravenous Given 11/04/24 1656)  morphine (PF) 4 MG/ML injection 4 mg (4 mg Intravenous Given 11/04/24 1657)     IMPRESSION / MDM / ASSESSMENT AND PLAN / ED COURSE  I reviewed the triage vital  signs and the nursing notes.                              Differential diagnosis includes, but is not limited to, acute appendicitis, diverticulitis, urinary tract infection/pyelonephritis, bowel obstruction, colitis, renal colic, gastroenteritis, hernia, etc.   Patient's presentation is most consistent with acute complicated illness / injury requiring diagnostic workup.   Patient's diagnosis is consistent with acute left flank pain secondary to an 13 female obstructive ureteral calculus with evidence of mild hydronephrosis on the left.  Patient with an overall reassuring exam at this time.  Initially patient is afebrile and without tachycardia, but on into evaluation update, patient with an uptrending temp as well as evidence of tachycardia in the teens.  She is endorsing improvement of her pain after IV medication administration.  Patient and husband are updated on  the diagnosis and plan for admission, and both are agreeable at this time.  Patient will be admitted to the hospital service.   Clinical Course as of 11/04/24 1839  Sun Nov 04, 2024  1808 Dr. Sherwood Edison (URO) in the ED to evaluate the patient.  He will admit the patient to the hospital service for planned surgical intervention tomorrow. [JM]    Clinical Course User Index [JM] Ritha Sampedro, Candida LULLA Kings, PA-C    FINAL CLINICAL IMPRESSION(S) / ED DIAGNOSES   Final diagnoses:  Left flank pain  Ureterolithiasis  Complicated UTI (urinary tract infection)     Rx / DC Orders   ED Discharge Orders     None        Note:  This document was prepared using Dragon voice recognition software and may include unintentional dictation errors.    Loyd Candida LULLA Kings, PA-C 11/04/24 2319    Clarine Ozell LABOR, MD 11/06/24 [phone]

## 2024-11-04 NOTE — Anesthesia Procedure Notes (Signed)
 Procedure Name: Intubation Date/Time: 11/04/2024 7:39 PM  Performed by: Landy Francena BIRCH, CRNAPre-anesthesia Checklist: Patient identified, Emergency Drugs available, Suction available and Patient being monitored Patient Re-evaluated:Patient Re-evaluated prior to induction Oxygen Delivery Method: Circle system utilized Preoxygenation: Pre-oxygenation with 100% oxygen Induction Type: IV induction Ventilation: Mask ventilation without difficulty Laryngoscope Size: McGrath and 3 Grade View: Grade I Tube type: Oral Tube size: 7.0 mm Number of attempts: 1 Airway Equipment and Method: Stylet, Oral airway and Bite block Placement Confirmation: ETT inserted through vocal cords under direct vision, positive ETCO2 and breath sounds checked- equal and bilateral Secured at: 21 cm Tube secured with: Tape Dental Injury: Teeth and Oropharynx as per pre-operative assessment

## 2024-11-04 NOTE — Transfer of Care (Signed)
 Immediate Anesthesia Transfer of Care Note  Patient: Michele Todd  Procedure(s) Performed: CYSTOSCOPY, WITH RETROGRADE PYELOGRAM AND URETERAL STENT INSERTION (Left)  Patient Location: PACU  Anesthesia Type:General  Level of Consciousness: drowsy  Airway & Oxygen Therapy: Patient Spontanous Breathing and Patient connected to nasal cannula oxygen  Post-op Assessment: Report given to RN, Post -op Vital signs reviewed and stable, and Patient moving all extremities  Post vital signs: Reviewed and stable  Last Vitals:  Vitals Value Taken Time  BP 155/82 11/04/24 20:19  Temp    Pulse 112 11/04/24 20:22  Resp 19 11/04/24 20:22  SpO2 98 % 11/04/24 20:22  Vitals shown include unfiled device data.  Last Pain:  Vitals:   11/04/24 1802  TempSrc: Oral  PainSc:          Complications: No notable events documented.

## 2024-11-04 NOTE — Anesthesia Preprocedure Evaluation (Signed)
 Anesthesia Evaluation  Patient identified by MRN, date of birth, ID band Patient awake    Reviewed: Allergy & Precautions, H&P , NPO status , Patient's Chart, lab work & pertinent test results, reviewed documented beta blocker date and time   History of Anesthesia Complications Negative for: history of anesthetic complications  Airway Mallampati: II  TM Distance: >3 FB Neck ROM: full    Dental  (+) Dental Advidsory Given, Teeth Intact   Pulmonary neg pulmonary ROS, former smoker   Pulmonary exam normal breath sounds clear to auscultation       Cardiovascular Exercise Tolerance: Good negative cardio ROS Normal cardiovascular exam Rhythm:regular Rate:Normal     Neuro/Psych negative neurological ROS  negative psych ROS   GI/Hepatic negative GI ROS, Neg liver ROS,,,  Endo/Other  negative endocrine ROS    Renal/GU Renal disease (kidney stones)  negative genitourinary   Musculoskeletal   Abdominal   Peds  Hematology negative hematology ROS (+)   Anesthesia Other Findings Past Medical History: No date: Headache     Comment:  sinus No date: History of kidney stones No date: Numbness and tingling of right lower extremity     Comment:  R/T pinched nerve No date: Urinary tract infection No date: Vertigo     Comment:  x1, over 15 yrs ago   Reproductive/Obstetrics negative OB ROS                              Anesthesia Physical Anesthesia Plan  ASA: 2 and emergent  Anesthesia Plan: General   Post-op Pain Management:    Induction: Intravenous, Rapid sequence and Cricoid pressure planned  PONV Risk Score and Plan: 3 and Ondansetron , Dexamethasone  and Treatment may vary due to age or medical condition  Airway Management Planned: Oral ETT  Additional Equipment:   Intra-op Plan:   Post-operative Plan: Extubation in OR  Informed Consent: I have reviewed the patients History and  Physical, chart, labs and discussed the procedure including the risks, benefits and alternatives for the proposed anesthesia with the patient or authorized representative who has indicated his/her understanding and acceptance.     Dental Advisory Given  Plan Discussed with: Anesthesiologist, CRNA and Surgeon  Anesthesia Plan Comments:         Anesthesia Quick Evaluation

## 2024-11-04 NOTE — Consult Note (Addendum)
 H&P Physician requesting consult: Ozell Klein  Chief Complaint: Left ureteral calculus, UTI with concern for impending sepsis  History of Present Illness: 73 year old female presented with severe left-sided abdominal pain.  Also with some nausea and vomiting.  In the emergency department, creatinine was 0.76 with no leukocytosis.  Urinalysis consistent with possible UTI with small leukocyte, positive nitrite, many bacteria, 21-50 WBC.  During evaluation, she started to have tachycardia now in the 110s and temperature 99 from 97.6.  Started to have chills and shivering.  CT scan was performed that revealed a 6 mm proximal left ureteral calculus with small focus of air in the bladder.  Patient states she does not feel well.  This is her first stone.  She is followed by Dr. Francisca for recurrent UTI, history of nonobstructing stone, and overactive bladder on Myrbetriq .  Past Medical History:  Diagnosis Date   Headache    sinus   History of kidney stones    Numbness and tingling of right lower extremity    R/T pinched nerve   Urinary tract infection    Vertigo    x1, over 15 yrs ago   Past Surgical History:  Procedure Laterality Date   ABDOMINAL HYSTERECTOMY     ANTERIOR AND POSTERIOR REPAIR N/A 10/26/2021   Procedure: ANTERIOR (CYSTOCELE) AND POSTERIOR REPAIR (RECTOCELE);  Surgeon: Schermerhorn, Debby PARAS, MD;  Location: [facility];  Service: Gynecology;  Laterality: N/A;   CATARACT EXTRACTION, BILATERAL     CHOLECYSTECTOMY     KNEE ARTHROSCOPY WITH LATERAL MENISECTOMY Left 10/18/2022   Procedure: Left knee arthroscopic partial medial and lateral meniscectomy;  Surgeon: Tobie Priest, MD;  Location: [facility];  Service: Orthopedics;  Laterality: Left;   KNEE ARTHROSCOPY WITH MEDIAL MENISECTOMY Left 10/18/2022   Procedure: Left knee arthroscopic partial medial and lateral meniscectomy;  Surgeon: Tobie Priest, MD;  Location: [facility];  Service: Orthopedics;  Laterality: Left;    KNEE ARTHROSCOPY WITH MENISCAL REPAIR Right 08/23/2017   Procedure: KNEE ARTHROSCOPY PARTIAL MEDIAL AND LATERNAL MENISCECTOMY WITH POSSIBLE CHONDROPLASTY /DEBRIDEMENT OF PATELLAFEMORAL COMPARTMENT;  Surgeon: Tobie Priest, MD;  Location: [facility];  Service: Orthopedics;  Laterality: Right;   prolapsed bladder repair N/A 10/2021   TONSILLECTOMY      Home Medications:  (Not in a hospital admission)  Allergies: No Known Allergies  Family History  Problem Relation Age of Onset   Lung cancer Mother    Breast cancer Paternal Aunt        20's   Breast cancer Paternal Aunt        In Late 33's.   Kidney disease Sister    Social History:  reports that she has quit smoking. She has never used smokeless tobacco. She reports current alcohol use. She reports that she does not use drugs.  ROS: A complete review of systems was performed.  All systems are negative except for pertinent findings as noted. ROS   Physical Exam:  Vital signs in last 24 hours: Temp:  [97.6 F (36.4 C)-99 F (37.2 C)] 99 F (37.2 C) (11/30 1802) Pulse Rate:  [84-113] 113 (11/30 1802) Resp:  [18] 18 (11/30 1521) BP: (150)/(86) 150/86 (11/30 1521) SpO2:  [100 %] 100 % (11/30 1730) Weight:  [84.8 kg] 84.8 kg (11/30 1519) General:  Alert and oriented, No acute distress but appears to not feel well HEENT: Normocephalic, atraumatic Neck: No JVD or lymphadenopathy Cardiovascular: Regular rate and rhythm Lungs: Regular rate and effort Abdomen: Soft, nontender, nondistended, no abdominal masses  Back: No CVA tenderness Extremities: No edema Neurologic: Grossly intact  Laboratory Data:  Results for orders placed or performed during the hospital encounter of 11/04/24 (from the past 24 hours)  Lipase, blood     Status: None   Collection Time: 11/04/24  3:20 PM  Result Value Ref Range   Lipase 25 11 - 51 U/L  Comprehensive metabolic panel     Status: Abnormal   Collection Time: 11/04/24  3:20 PM   Result Value Ref Range   Sodium 138 135 - 145 mmol/L   Potassium 4.7 3.5 - 5.1 mmol/L   Chloride 102 98 - 111 mmol/L   CO2 27 22 - 32 mmol/L   Glucose, Bld 107 (H) 70 - 99 mg/dL   BUN 18 8 - 23 mg/dL   Creatinine, Ser 9.23 0.44 - 1.00 mg/dL   Calcium 89.1 (H) 8.9 - 10.3 mg/dL   Total Protein 7.4 6.5 - 8.1 g/dL   Albumin 4.5 3.5 - 5.0 g/dL   AST 29 15 - 41 U/L   ALT 31 0 - 44 U/L   Alkaline Phosphatase 67 38 - 126 U/L   Total Bilirubin 0.6 0.0 - 1.2 mg/dL   GFR, Estimated >39 >39 mL/min   Anion gap 9 5 - 15  CBC     Status: None   Collection Time: 11/04/24  3:20 PM  Result Value Ref Range   WBC 9.3 4.0 - 10.5 K/uL   RBC 4.54 3.87 - 5.11 MIL/uL   Hemoglobin 14.0 12.0 - 15.0 g/dL   HCT 57.2 63.9 - 53.9 %   MCV 94.1 80.0 - 100.0 fL   MCH 30.8 26.0 - 34.0 pg   MCHC 32.8 30.0 - 36.0 g/dL   RDW 87.3 88.4 - 84.4 %   Platelets 285 150 - 400 K/uL   nRBC 0.0 0.0 - 0.2 %  Urinalysis, Routine w reflex microscopic -Urine, Random     Status: Abnormal   Collection Time: 11/04/24  3:20 PM  Result Value Ref Range   Color, Urine AMBER (A) YELLOW   APPearance CLOUDY (A) CLEAR   Specific Gravity, Urine 1.017 1.005 - 1.030   pH 6.0 5.0 - 8.0   Glucose, UA NEGATIVE NEGATIVE mg/dL   Hgb urine dipstick NEGATIVE NEGATIVE   Bilirubin Urine NEGATIVE NEGATIVE   Ketones, ur 5 (A) NEGATIVE mg/dL   Protein, ur NEGATIVE NEGATIVE mg/dL   Nitrite POSITIVE (A) NEGATIVE   Leukocytes,Ua SMALL (A) NEGATIVE   RBC / HPF 21-50 0 - 5 RBC/hpf   WBC, UA 21-50 0 - 5 WBC/hpf   Bacteria, UA MANY (A) NONE SEEN   Squamous Epithelial / HPF 0-5 0 - 5 /HPF   Mucus PRESENT    No results found for this or any previous visit (from the past 240 hours). Creatinine: Recent Labs    11/04/24 1520  CREATININE 0.76   CT scan personally reviewed and is detailed in the history of present illness  Impression/Assessment:  Left ureteral calculus Left ureteral obstruction secondary to calculus Urinary tract infection  with concern for impending sepsis  Plan:  Will proceed emergently to operating room for cystoscopy with left retrograde pyelogram and left ureteral stent placement.  Her vital signs are concerning for progression to sepsis criteria.  This is a emergent case.  Risk benefits discussed.  She will need continued antibiotics, hospitalist admission, and we will plan for ureteroscopy in a couple of weeks or so.  Her calcium level was 10.8.  May benefit  from hypercalcemia workup  Sherwood JONETTA Edison, III 11/04/2024, 6:26 PM

## 2024-11-05 ENCOUNTER — Other Ambulatory Visit: Payer: Self-pay | Admitting: Urology

## 2024-11-05 ENCOUNTER — Encounter: Payer: Self-pay | Admitting: Urology

## 2024-11-05 DIAGNOSIS — N12 Tubulo-interstitial nephritis, not specified as acute or chronic: Secondary | ICD-10-CM | POA: Diagnosis not present

## 2024-11-05 DIAGNOSIS — N201 Calculus of ureter: Secondary | ICD-10-CM

## 2024-11-05 LAB — COMPREHENSIVE METABOLIC PANEL WITH GFR
ALT: 37 U/L (ref 0–44)
AST: 36 U/L (ref 15–41)
Albumin: 3.7 g/dL (ref 3.5–5.0)
Alkaline Phosphatase: 58 U/L (ref 38–126)
Anion gap: 11 (ref 5–15)
BUN: 14 mg/dL (ref 8–23)
CO2: 22 mmol/L (ref 22–32)
Calcium: 9.4 mg/dL (ref 8.9–10.3)
Chloride: 106 mmol/L (ref 98–111)
Creatinine, Ser: 0.65 mg/dL (ref 0.44–1.00)
GFR, Estimated: >60 mL/min (ref >60)
Glucose, Bld: 125 mg/dL — ABNORMAL HIGH (ref 70–99)
Potassium: 3.8 mmol/L (ref 3.5–5.1)
Sodium: 140 mmol/L (ref 135–145)
Total Bilirubin: 0.6 mg/dL (ref 0.0–1.2)
Total Protein: 6.3 g/dL — ABNORMAL LOW (ref 6.5–8.1)

## 2024-11-05 LAB — CBC
HCT: 37.9 % (ref 36.0–46.0)
Hemoglobin: 12.3 g/dL (ref 12.0–15.0)
MCH: 30.9 pg (ref 26.0–34.0)
MCHC: 32.5 g/dL (ref 30.0–36.0)
MCV: 95.2 fL (ref 80.0–100.0)
Platelets: 219 K/uL (ref 150–400)
RBC: 3.98 MIL/uL (ref 3.87–5.11)
RDW: 12.8 % (ref 11.5–15.5)
WBC: 24.1 K/uL — ABNORMAL HIGH (ref 4.0–10.5)
nRBC: 0 % (ref 0.0–0.2)

## 2024-11-05 NOTE — Plan of Care (Signed)
  Problem: Education: Goal: Knowledge of General Education information will improve Description: Including pain rating scale, medication(s)/side effects and non-pharmacologic comfort measures Outcome: Progressing   Problem: Health Behavior/Discharge Planning: Goal: Ability to manage health-related needs will improve Outcome: Progressing   Problem: Pain Managment: Goal: General experience of comfort will improve and/or be controlled Outcome: Progressing   Problem: Skin Integrity: Goal: Risk for impaired skin integrity will decrease Outcome: Progressing

## 2024-11-05 NOTE — Progress Notes (Signed)
 Surgical Physician Order Form [facility]  * Scheduling expectation :  2 to 3 weeks with Dr. Francisca   *Length of Case:   *Clearance needed: no  *Anticoagulation Instructions: N/A  *Aspirin  Instructions: N/A  *Post-op visit Date/Instructions:   TBD  *Diagnosis: Left Ureteral Stone  *Procedure: left  Ureteroscopy w/laser lithotripsy & stent exchange (47643)   Additional orders: N/A  -Admit type: OUTpatient  -Anesthesia: General  -VTE Prophylaxis Standing Order SCD's       Other:   -Standing Lab Orders Per Anesthesia    Lab other: UA&Urine Culture  -Standing Test orders EKG/Chest x-ray per Anesthesia       Test other:   - Medications:  Ancef  2gm IV  -Other orders:  N/A

## 2024-11-05 NOTE — Anesthesia Postprocedure Evaluation (Signed)
 Anesthesia Post Note  Patient: Michele Todd  Procedure(s) Performed: CYSTOSCOPY, WITH RETROGRADE PYELOGRAM AND URETERAL STENT INSERTION (Left)  Patient location during evaluation: PACU Anesthesia Type: General Level of consciousness: awake and alert Pain management: pain level controlled Vital Signs Assessment: post-procedure vital signs reviewed and stable Respiratory status: spontaneous breathing, nonlabored ventilation, respiratory function stable and patient connected to nasal cannula oxygen Cardiovascular status: blood pressure returned to baseline and stable Postop Assessment: no apparent nausea or vomiting Anesthetic complications: no   No notable events documented.   Last Vitals:  Vitals:   11/04/24 2045 11/05/24 0413  BP: 132/68 (!) 125/59  Pulse: (!) 110 98  Resp: 17 20  Temp: 37.1 C 36.8 C  SpO2: 95% 95%    Last Pain:  Vitals:   11/04/24 2045  TempSrc:   PainSc: 0-No pain                 Prentice Murphy

## 2024-11-05 NOTE — TOC CM/SW Note (Signed)
 Transition of Care [facility]) CM/SW Note    Transition of Care [facility]) - Inpatient Brief Assessment   Patient Details  Name: Michele Todd MRN: [id number] Date of Birth: June 18, 1951  Transition of Care [facility]) CM/SW Contact:    Alfonso Rummer, LCSW Phone Number: 11/05/2024, 11:07 AM   Clinical Narrative:  TOC chart review completed. NO TOC needs identified please contact toc should needs arise  Transition of Care Asessment: Insurance and Status: Insurance coverage has been reviewed Patient has primary care physician: Yes (CARTER, DANIELLE) Home environment has been reviewed: single family home   Prior/Current Home Services: No current home services Social Drivers of Health Review: SDOH reviewed no interventions necessary Readmission risk has been reviewed: No Transition of care needs: no transition of care needs at this time

## 2024-11-05 NOTE — Progress Notes (Signed)
 Mobility Specialist Progress Note:    11/05/24 1103  Mobility  Activity Ambulated with assistance  Level of Assistance Modified independent, requires aide device or extra time  Assistive Device Front wheel walker  Distance Ambulated (ft) 55 ft  Range of Motion/Exercises Active;All extremities  Activity Response Tolerated well  Mobility visit 1 Mobility  Mobility Specialist Start Time (ACUTE ONLY) 1041  Mobility Specialist Stop Time (ACUTE ONLY) 1100  Mobility Specialist Time Calculation (min) (ACUTE ONLY) 19 min   Pt received in chair, husband in room. Agreeable to mobility, ModI to stand and ambulate with RW. Tolerated well, asx throughout. Returned to chair, all needs met.  Sherrilee Ditty Mobility Specialist Please contact via Special Educational Needs Teacher or  Rehab office at [phone]

## 2024-11-05 NOTE — Progress Notes (Signed)
 Patient siting up in bed. Denies pain or discomfort at this time. Declines getting up and OOB. Encouraged patient to get up and walk.

## 2024-11-05 NOTE — Progress Notes (Signed)
 Urology Consult Follow Up  Subjective: She is resting comfortable in bed with her husband.  She denies any stent discomfort.  VSS afebrile  Serum creatinine 0.65  WBC count 24.1, hemoglobin of 12.3.  Foley in place with good urinary output very light pink/yellow clear urine  Anti-infectives: Anti-infectives (From admission, onward)    Start     Dose/Rate Route Frequency Ordered Stop   11/05/24 1900  cefTRIAXone (ROCEPHIN) 1 g in sodium chloride  0.9 % 100 mL IVPB        1 g 200 mL/hr over 30 Minutes Intravenous Every 24 hours 11/04/24 1850     11/04/24 1800  cefTRIAXone (ROCEPHIN) 2 g in sodium chloride  0.9 % 100 mL IVPB        2 g 200 mL/hr over 30 Minutes Intravenous  Once 11/04/24 1754 11/05/24 1116       Current Facility-Administered Medications  Medication Dose Route Frequency Provider Last Rate Last Admin   acetaminophen  (TYLENOL ) tablet 650 mg  650 mg Oral Q6H PRN Acheampong, Peter K, MD       Or   acetaminophen  (TYLENOL ) suppository 650 mg  650 mg Rectal Q6H PRN Acheampong, Peter K, MD       albuterol (PROVENTIL) (2.5 MG/3ML) 0.083% nebulizer solution 2.5 mg  2.5 mg Nebulization Q2H PRN Acheampong, Peter K, MD       atorvastatin (LIPITOR) tablet 20 mg  20 mg Oral QHS Acheampong, Peter K, MD   20 mg at 11/04/24 2207   cefTRIAXone (ROCEPHIN) 1 g in sodium chloride  0.9 % 100 mL IVPB  1 g Intravenous Q24H Acheampong, Peter K, MD       hydrALAZINE (APRESOLINE) injection 5 mg  5 mg Intravenous Q6H PRN Acheampong, Peter K, MD       loratadine (CLARITIN) tablet 10 mg  10 mg Oral Daily Acheampong, Peter K, MD   10 mg at 11/05/24 0811   mirabegron  ER (MYRBETRIQ ) tablet 50 mg  50 mg Oral Daily Acheampong, Peter K, MD   50 mg at 11/05/24 9188   ondansetron  (ZOFRAN ) tablet 4 mg  4 mg Oral Q6H PRN Acheampong, Peter K, MD       Or   ondansetron  (ZOFRAN ) injection 4 mg  4 mg Intravenous Q6H PRN Acheampong, Peter K, MD       senna-docusate (Senokot-S) tablet 1 tablet  1 tablet Oral  QHS PRN Acheampong, Peter K, MD       tamsulosin (FLOMAX) capsule 0.4 mg  0.4 mg Oral Daily Acheampong, Maude POUR, MD   0.4 mg at 11/05/24 9188     Objective: Vital signs in last 24 hours: Temp:  [97.6 F (36.4 C)-99 F (37.2 C)] 98.1 F (36.7 C) (12/01 0738) Pulse Rate:  [84-113] 97 (12/01 0738) Resp:  [13-20] 16 (12/01 0738) BP: (125-155)/(59-86) 126/63 (12/01 0738) SpO2:  [93 %-100 %] 95 % (12/01 0738) Weight:  [84.8 kg] 84.8 kg (11/30 1519)  Intake/Output from previous day: 11/30 0701 - 12/01 0700 In: 1561.8 [I.V.:1461.8; IV Piggyback:100] Out: 1150 [Urine:1150] Intake/Output this shift: Total I/O In: -  Out: 600 [Urine:600]   Physical Exam Vitals and nursing note reviewed.  Constitutional:      Appearance: Normal appearance. She is normal weight.  HENT:     Head: Normocephalic.     Nose: Nose normal.     Mouth/Throat:     Mouth: Mucous membranes are dry.     Pharynx: Oropharynx is clear.  Eyes:     Extraocular Movements: Extraocular  movements intact.     Conjunctiva/sclera: Conjunctivae normal.     Pupils: Pupils are equal, round, and reactive to light.  Pulmonary:     Effort: Pulmonary effort is normal.     Breath sounds: Normal breath sounds.  Abdominal:     Palpations: Abdomen is soft.  Musculoskeletal:        General: Normal range of motion.     Cervical back: Normal range of motion.  Skin:    General: Skin is warm and dry.  Neurological:     Mental Status: She is alert and oriented to person, place, and time.  Psychiatric:        Mood and Affect: Mood normal.        Behavior: Behavior normal.        Thought Content: Thought content normal.        Judgment: Judgment normal.     Lab Results:  Recent Labs    11/04/24 1520 11/05/24 0410  WBC 9.3 24.1*  HGB 14.0 12.3  HCT 42.7 37.9  PLT 285 219   BMET Recent Labs    11/04/24 1520 11/05/24 0410  NA 138 140  K 4.7 3.8  CL 102 106  CO2 27 22  GLUCOSE 107* 125*  BUN 18 14  CREATININE  0.76 0.65  CALCIUM 10.8* 9.4   PT/INR No results for input(s): LABPROT, INR in the last 72 hours. ABG No results for input(s): PHART, HCO3 in the last 72 hours.  Invalid input(s): PCO2, PO2  Studies/Results: DG OR UROLOGY CYSTO IMAGE ([facility] ONLY) Result Date: 11/04/2024 There is no interpretation for this exam.  This order is for images obtained during a surgical procedure.  Please See Surgeries Tab for more information regarding the procedure.   CT Renal Stone Study Result Date: 11/04/2024 CLINICAL DATA:  Abdominal and flank pain, left-sided. EXAM: CT ABDOMEN AND PELVIS WITHOUT CONTRAST TECHNIQUE: Multidetector CT imaging of the abdomen and pelvis was performed following the standard protocol without IV contrast. RADIATION DOSE REDUCTION: This exam was performed according to the departmental dose-optimization program which includes automated exposure control, adjustment of the mA and/or kV according to patient size and/or use of iterative reconstruction technique. COMPARISON:  CT abdomen and pelvis 08/20/2019 FINDINGS: Lower chest: There is a 2 mm nodular density in the right lower lobe. The lung bases are otherwise clear. Hepatobiliary: No focal liver abnormality is seen. Status post cholecystectomy. No biliary dilatation. Pancreas: Pancreatic head calcifications are present. No acute inflammation of the pancreas. Spleen: Normal in size without focal abnormality. Adrenals/Urinary Tract: There is a 6 mm calculus in the proximal left ureter. There is mild left-sided hydroureteronephrosis and mild left perinephric fat stranding. No additional renal calculi are seen. The adrenal glands and right kidney are within normal limits. The bladder is decompressed. There is a small focus of air in the bladder. Stomach/Bowel: There is a small hiatal hernia. Stomach is within normal limits. Appendix appears normal. No evidence of bowel wall thickening, distention, or inflammatory changes. There is a  large amount of stool throughout the colon. Vascular/Lymphatic: Aortic atherosclerosis. No enlarged abdominal or pelvic lymph nodes. Reproductive: Status post hysterectomy. No adnexal masses. Other: No abdominal wall hernia or abnormality. No abdominopelvic ascites. Musculoskeletal: No acute or significant osseous findings. IMPRESSION: 1. 6 mm calculus in the proximal left ureter with mild obstructive uropathy. 2. Small focus of air in the bladder may be related to recent instrumentation or infection. 3. Right solid pulmonary nodule measuring 2 mm. No follow-up  needed if patient is low-risk.This recommendation follows the consensus statement: Guidelines for Management of Incidental Pulmonary Nodules Detected on CT Images: From the Fleischner Society 2017; Radiology 2017; [id number]. Aortic Atherosclerosis (ICD10-I70.0). Electronically Signed   By: Greig Pique M.D.   On: 11/04/2024 17:41     Assessment: 73 year old woman who underwent left tandem ureteral stent placement in the setting of a left ureteral calculus with urinary tract infection without a completely duplicated system.  Intraoperative findings are normal urethra and bladder.  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  The lower pole moiety was most likely obstructed moiety with mild hydronephrosis and no significant hydronephrosis of the upper pole moiety but given her sepsis, tandem stents were placed.    - Her serum creatinine is normal  - Hemoglobin and hematocrit are normal  - CBC with leukocytosis  - She is afebrile and normotensive   Plan: - Will go ahead and discontinue Foley as she has been afebrile and she desires to have it out - Continue to trend cultures - Continue to trend CBC - Continue broad-spectrum antibiotics until culture results are available and then narrow, will need to be on 10 days of culture appropriate antibiotics - Will need definitive stone  management on an outpatient basis - Recommend tamsulosin  0.4 mg daily and continue Myrbetriq  50 mg daily for stent discomfort - Patient counseled on operative technique and associated risks. Informed of planned ureteral stent placement, expected to remain in situ for 3-10 days, with potential for stent-related symptoms including flank pain, bladder discomfort, dysuria, urgency, frequency, urinary incontinence, and gross hematuria. - Stent removal options reviewed--either via office cystoscopy or self-removal via extraction string, to be determined intraoperatively. - Residual nephrolithiasis or ureterolithiasis may persist post-procedure, potentially requiring future intervention. - Intraoperative risks include ureteral injury, which may necessitate open surgical repair. Additional risks include hemorrhage, infection, and complications related to general anesthesia (e.g., MI, CVA, paralysis, coma, death). - Her and her husband are agreeable and wished to proceed - Booking sheet sent     LOS: 1 day    [facility] 11/05/2024

## 2024-11-05 NOTE — Progress Notes (Signed)
 [facility]  - [place] [facility]   PATIENT NAME: Michele Todd    MR#:  [id number]  DATE OF BIRTH:  1951/05/28  SUBJECTIVE:  husband at bedside. Urology PA was at bedside as well. Overall doing well. No fever. Tolerating PO diet. She is wondering when Foley catheter will come out. Had stent placement yesterday tolerated procedure well. No other issues.    VITALS:  Blood pressure 126/63, pulse 97, temperature 98.1 F (36.7 C), resp. rate 16, height 5' 7 (1.702 m), weight 84.8 kg, SpO2 95%.  PHYSICAL EXAMINATION:   GENERAL:  73 y.o.-year-old patient with no acute distress.  LUNGS: Normal breath sounds bilaterally, no wheezing CARDIOVASCULAR: S1, S2 normal. No murmur   ABDOMEN: Soft, nontender, nondistended. Bowel sounds present.  EXTREMITIES: No  edema b/l.    NEUROLOGIC: nonfocal  patient is alert and awake SKIN: No obvious rash, lesion, or ulcer.   LABORATORY PANEL:  CBC Recent Labs  Lab 11/05/24 0410  WBC 24.1*  HGB 12.3  HCT 37.9  PLT 219    Chemistries  Recent Labs  Lab 11/05/24 0410  NA 140  K 3.8  CL 106  CO2 22  GLUCOSE 125*  BUN 14  CREATININE 0.65  CALCIUM 9.4  AST 36  ALT 37  ALKPHOS 58  BILITOT 0.6   Cardiac Enzymes No results for input(s): TROPONINI in the last 168 hours. RADIOLOGY:  DG OR UROLOGY CYSTO IMAGE ([facility] ONLY) Result Date: 11/04/2024 There is no interpretation for this exam.  This order is for images obtained during a surgical procedure.  Please See Surgeries Tab for more information regarding the procedure.   CT Renal Stone Study Result Date: 11/04/2024 CLINICAL DATA:  Abdominal and flank pain, left-sided. EXAM: CT ABDOMEN AND PELVIS WITHOUT CONTRAST TECHNIQUE: Multidetector CT imaging of the abdomen and pelvis was performed following the standard protocol without IV contrast. RADIATION DOSE REDUCTION: This exam was performed according to the departmental dose-optimization program which includes automated  exposure control, adjustment of the mA and/or kV according to patient size and/or use of iterative reconstruction technique. COMPARISON:  CT abdomen and pelvis 08/20/2019 FINDINGS: Lower chest: There is a 2 mm nodular density in the right lower lobe. The lung bases are otherwise clear. Hepatobiliary: No focal liver abnormality is seen. Status post cholecystectomy. No biliary dilatation. Pancreas: Pancreatic head calcifications are present. No acute inflammation of the pancreas. Spleen: Normal in size without focal abnormality. Adrenals/Urinary Tract: There is a 6 mm calculus in the proximal left ureter. There is mild left-sided hydroureteronephrosis and mild left perinephric fat stranding. No additional renal calculi are seen. The adrenal glands and right kidney are within normal limits. The bladder is decompressed. There is a small focus of air in the bladder. Stomach/Bowel: There is a small hiatal hernia. Stomach is within normal limits. Appendix appears normal. No evidence of bowel wall thickening, distention, or inflammatory changes. There is a large amount of stool throughout the colon. Vascular/Lymphatic: Aortic atherosclerosis. No enlarged abdominal or pelvic lymph nodes. Reproductive: Status post hysterectomy. No adnexal masses. Other: No abdominal wall hernia or abnormality. No abdominopelvic ascites. Musculoskeletal: No acute or significant osseous findings. IMPRESSION: 1. 6 mm calculus in the proximal left ureter with mild obstructive uropathy. 2. Small focus of air in the bladder may be related to recent instrumentation or infection. 3. Right solid pulmonary nodule measuring 2 mm. No follow-up needed if patient is low-risk.This recommendation follows the consensus statement: Guidelines for Management of Incidental Pulmonary Nodules Detected  on CT Images: From the Fleischner Society 2017; Radiology 2017; [phone]. Aortic Atherosclerosis (ICD10-I70.0). Electronically Signed   By: Greig Pique M.D.    On: 11/04/2024 17:41    Assessment and Plan  Michele Todd is a 73 y.o. female with medical history significant of recurrent urinary tract infection status post cystoscopy in 2021 with no significant findings, history of nonobstructing kidney stone, overactive bladder on Myrbetriq , osteoarthritis, osteopenia on DEXA AND status post cholecystectomy.  She presents to the emergency room with complaints of left flank pain over a few days duration.   CT scan of the abdomen and pelvis revealed a 6 mm proximal left ureteral calculus. Patient was seen and assessed by urology in the ED.  Patient was recommended cystoscopy with left retrograde pyelogram and ureteral stent placement.  Ureteric colic secondary to 6 mm obstructing stone, Left.   --Seen and evaluated by urology pt is s/p cystoscopy, retrograde pyelogram and stent placement.  -- received IV fluids.   --cont Flomax  --d/c foley per Urology PA   Acute urinary tract infection in the context of ureteric stone  Leucocytosis  --Urine cultures has been obtained and pending.   --Blood cultures not sent by ER --Cont IV rocephin  --CBC in am   Incidental finding of 2 mm pulmonary nodule.  Patient was low risk.  Will defer to primary care for follow-up.  Procedures: Family communication : husband Consults : urology CODE STATUS: full DVT Prophylaxis : ambulation Level of care: Med-Surg Status is: Inpatient Remains inpatient appropriate because: will monitor white count for one more day. Remove Foley. If remains stable discharge tomorrow    TOTAL TIME TAKING CARE OF THIS PATIENT: 40 minutes.  >50% time spent on counselling and coordination of care  Note: This dictation was prepared with Dragon dictation along with smaller phrase technology. Any transcriptional errors that result from this process are unintentional.  Leita Blanch M.D    [facility]   CC: Primary care physician; Franchot Houston, PA-C

## 2024-11-05 NOTE — Progress Notes (Signed)
 Patient said she is not longer taking magnesium. Dr Tobie informed and d/c the order

## 2024-11-05 NOTE — Plan of Care (Signed)
   Problem: Education: Goal: Knowledge of General Education information will improve Description: Including pain rating scale, medication(s)/side effects and non-pharmacologic comfort measures Outcome: Progressing   Problem: Clinical Measurements: Goal: Ability to maintain clinical measurements within normal limits will improve Outcome: Progressing Goal: Will remain free from infection Outcome: Progressing

## 2024-11-06 DIAGNOSIS — N12 Tubulo-interstitial nephritis, not specified as acute or chronic: Secondary | ICD-10-CM | POA: Diagnosis not present

## 2024-11-06 LAB — URINE CULTURE: Culture: NO GROWTH

## 2024-11-06 LAB — CBC
HCT: 34.8 % — ABNORMAL LOW (ref 36.0–46.0)
Hemoglobin: 11 g/dL — ABNORMAL LOW (ref 12.0–15.0)
MCH: 30.6 pg (ref 26.0–34.0)
MCHC: 31.6 g/dL (ref 30.0–36.0)
MCV: 96.7 fL (ref 80.0–100.0)
Platelets: 186 K/uL (ref 150–400)
RBC: 3.6 MIL/uL — ABNORMAL LOW (ref 3.87–5.11)
RDW: 13.1 % (ref 11.5–15.5)
WBC: 13.6 K/uL — ABNORMAL HIGH (ref 4.0–10.5)
nRBC: 0 % (ref 0.0–0.2)

## 2024-11-06 MED ORDER — TAMSULOSIN HCL 0.4 MG PO CAPS: 0.4000 mg | ORAL_CAPSULE | Freq: Every day | ORAL | 0 refills | Status: DC

## 2024-11-06 MED ORDER — CEPHALEXIN 500 MG PO CAPS: 500.0000 mg | ORAL_CAPSULE | Freq: Three times a day (TID) | ORAL | Status: DC

## 2024-11-06 MED ORDER — CEPHALEXIN 500 MG PO CAPS: 500.0000 mg | ORAL_CAPSULE | Freq: Three times a day (TID) | ORAL | 0 refills | Status: AC

## 2024-11-06 NOTE — Discharge Summary (Signed)
 Physician Discharge Summary   Patient: Michele Todd MRN: [id number] DOB: Sep 24, 1951  Admit date:     11/04/2024  Discharge date: 11/06/24  Discharge Physician: Leita Blanch   PCP: Franchot Houston, PA-C   Recommendations at discharge:    F/u [facility] Dr Francisca for Ureteral stone/stent and definitive treatment as out pt F/u PCP in 1-2 weeks  Discharge Diagnoses: Principal Problem:   Pyelonephritis Active Problems:   History of kidney stones   Michele Todd is a 73 y.o. female with medical history significant of recurrent urinary tract infection status post cystoscopy in 2021 with no significant findings, history of nonobstructing kidney stone, overactive bladder on Myrbetriq , osteoarthritis, osteopenia on DEXA AND status post cholecystectomy.  She presents to the emergency room with complaints of left flank pain over a few days duration.    CT scan of the abdomen and pelvis revealed a 6 mm proximal left ureteral calculus. Patient was seen and assessed by urology in the ED.  Patient was recommended cystoscopy with left retrograde pyelogram and ureteral stent placement.   Ureteric colic secondary to 6 mm obstructing stone, Left.   --Seen and evaluated by urology pt is s/p cystoscopy, retrograde pyelogram and stent placement.  -- received IV fluids.   --cont Flomax  --d/c foley per Urology PA --overall better. F/u Dr Francisca as out pt    Acute urinary tract infection in the context of ureteric stone  Leucocytosis  --Urine cultures has been obtained and pending.   --Blood cultures not sent by ER --Cont IV rocephin  --wbc down to 13K (24K)    Incidental finding of 2 mm pulmonary nodule.  Patient was low risk.  Will defer to primary care for follow-up.  D/c home. Pt agreeable. Urology PA Clotilda aware   Procedures: left ureteral stent placement Family communication : husband Consults : urology CODE STATUS: full DVT Prophylaxis : ambulation       Disposition: Home Diet recommendation:  Discharge Diet Orders (From admission, onward)     Start     Ordered   11/06/24 0000  Diet - low sodium heart healthy        11/06/24 0824           Cardiac diet DISCHARGE MEDICATION: Allergies as of 11/06/2024   No Known Allergies      Medication List     STOP taking these medications    magnesium 84 MG ( ) Tbcr SR tablet Commonly known as: MAGTAB       TAKE these medications    atorvastatin 20 MG tablet Commonly known as: LIPITOR Take 20 mg by mouth at bedtime.   b complex vitamins tablet Take 1 tablet by mouth daily.   cephALEXin 500 MG capsule Commonly known as: KEFLEX Take 1 capsule (500 mg total) by mouth every 8 (eight) hours for 24 doses.   cetirizine 10 MG tablet Commonly known as: ZYRTEC Take 10 mg by mouth daily.   estradiol  0.1 MG/GM vaginal cream Commonly known as: ESTRACE  Estrogen Cream Instruction Discard applicator Apply pea sized amount to tip of finger to urethra before bed. Wash hands well after application. Use Monday, Wednesday and Friday   Fish Oil 1200 MG Caps Take 1,200 mg by mouth daily.   hydroxypropyl methylcellulose / hypromellose 2.5 % ophthalmic solution Commonly known as: ISOPTO TEARS / GONIOVISC Place 1 drop into both eyes at bedtime.   mirabegron  ER 50 MG Tb24 tablet Commonly known as: MYRBETRIQ  Take 1 tablet (50 mg total)  by mouth daily.   MULTIVITAMIN ADULTS PO Take 1 tablet by mouth daily.   OSTEO BI-FLEX REGULAR STRENGTH PO Take 2 tablets by mouth daily.   tamsulosin 0.4 MG Caps capsule Commonly known as: FLOMAX Take 1 capsule (0.4 mg total) by mouth daily. Start taking on: November 07, 2024        Follow-up Information     Franchot Houston, PA-C. Schedule an appointment as soon as possible for a visit in 1 week(s).   Specialty: Physician Assistant Contact information: [address] Michele Todd [place] [address] [phone]          Francisca Redell BROCKS, MD. Go to.   Specialty: Urology Why: on the appt that will be scheduled. Call if you dont hear from the Urology office for f/u and procedure Contact information: [address] [place] [address] [phone]                Discharge Exam: Michele Todd   11/04/24 1519  Weight: 84.8 kg  GENERAL:  73 y.o.-year-old patient with no acute distress.  LUNGS: Normal breath sounds bilaterally, no wheezing CARDIOVASCULAR: S1, S2 normal. No murmur   ABDOMEN: Soft, nontender, nondistended. Bowel sounds present.  EXTREMITIES: No  edema b/l.    NEUROLOGIC: nonfocal  patient is alert and awake SKIN: No obvious rash, lesion, or ulcer.    Condition at discharge: fair  The results of significant diagnostics from this hospitalization (including imaging, microbiology, ancillary and laboratory) are listed below for reference.   Imaging Studies: DG OR UROLOGY CYSTO IMAGE ([facility] ONLY) Result Date: 11/04/2024 There is no interpretation for this exam.  This order is for images obtained during a surgical procedure.  Please See Surgeries Tab for more information regarding the procedure.   CT Renal Stone Study Result Date: 11/04/2024 CLINICAL DATA:  Abdominal and flank pain, left-sided. EXAM: CT ABDOMEN AND PELVIS WITHOUT CONTRAST TECHNIQUE: Multidetector CT imaging of the abdomen and pelvis was performed following the standard protocol without IV contrast. RADIATION DOSE REDUCTION: This exam was performed according to the departmental dose-optimization program which includes automated exposure control, adjustment of the mA and/or kV according to patient size and/or use of iterative reconstruction technique. COMPARISON:  CT abdomen and pelvis 08/20/2019 FINDINGS: Lower chest: There is a 2 mm nodular density in the right lower lobe. The lung bases are otherwise clear. Hepatobiliary: No focal liver abnormality is seen. Status post cholecystectomy. No biliary dilatation.  Pancreas: Pancreatic head calcifications are present. No acute inflammation of the pancreas. Spleen: Normal in size without focal abnormality. Adrenals/Urinary Tract: There is a 6 mm calculus in the proximal left ureter. There is mild left-sided hydroureteronephrosis and mild left perinephric fat stranding. No additional renal calculi are seen. The adrenal glands and right kidney are within normal limits. The bladder is decompressed. There is a small focus of air in the bladder. Stomach/Bowel: There is a small hiatal hernia. Stomach is within normal limits. Appendix appears normal. No evidence of bowel wall thickening, distention, or inflammatory changes. There is a large amount of stool throughout the colon. Vascular/Lymphatic: Aortic atherosclerosis. No enlarged abdominal or pelvic lymph nodes. Reproductive: Status post hysterectomy. No adnexal masses. Other: No abdominal wall hernia or abnormality. No abdominopelvic ascites. Musculoskeletal: No acute or significant osseous findings. IMPRESSION: 1. 6 mm calculus in the proximal left ureter with mild obstructive uropathy. 2. Small focus of air in the bladder may be related to recent instrumentation or infection. 3. Right solid pulmonary nodule measuring 2  mm. No follow-up needed if patient is low-risk.This recommendation follows the consensus statement: Guidelines for Management of Incidental Pulmonary Nodules Detected on CT Images: From the Fleischner Society 2017; Radiology 2017; 284:228-243. Aortic Atherosclerosis (ICD10-I70.0). Electronically Signed   By: Greig Pique M.D.   On: 11/04/2024 17:41    Microbiology: Results for orders placed or performed in visit on 02/14/24  Microscopic Examination     Status: Abnormal   Collection Time: 02/14/24 10:17 AM   Urine  Result Value Ref Range Status   WBC, UA 6-10 (A) 0 - 5 /hpf Final   RBC, Urine 0-2 0 - 2 /hpf Final   Epithelial Cells (non renal) 0-10 0 - 10 /hpf Final   Crystals Present (A) N/A Final    Crystal Type Amorphous Sediment N/A Final   Bacteria, UA Many (A) None seen/Few Final    Labs: CBC: Recent Labs  Lab 11/04/24 1520 11/05/24 0410 11/06/24 0352  WBC 9.3 24.1* 13.6*  HGB 14.0 12.3 11.0*  HCT 42.7 37.9 34.8*  MCV 94.1 95.2 96.7  PLT 285 219 186   Basic Metabolic Panel: Recent Labs  Lab 11/04/24 1520 11/05/24 0410  NA 138 140  K 4.7 3.8  CL 102 106  CO2 27 22  GLUCOSE 107* 125*  BUN 18 14  CREATININE 0.76 0.65  CALCIUM 10.8* 9.4   Liver Function Tests: Recent Labs  Lab 11/04/24 1520 11/05/24 0410  AST 29 36  ALT 31 37  ALKPHOS 67 58  BILITOT 0.6 0.6  PROT 7.4 6.3*  ALBUMIN 4.5 3.7     Discharge time spent: greater than 30 minutes.  Signed: Leita Blanch, MD [facility] 11/06/2024

## 2024-11-06 NOTE — Plan of Care (Signed)
  Problem: Education: Goal: Knowledge of General Education information will improve Description: Including pain rating scale, medication(s)/side effects and non-pharmacologic comfort measures Outcome: Adequate for Discharge   

## 2024-11-07 ENCOUNTER — Other Ambulatory Visit: Payer: Self-pay

## 2024-11-07 DIAGNOSIS — N201 Calculus of ureter: Secondary | ICD-10-CM

## 2024-11-07 NOTE — Progress Notes (Unsigned)
 Surgical Physician Order Form [facility] [place]  * Scheduling expectation :  2 to 3 weeks with Dr. Francisca   *Length of Case:   *Clearance needed: no  *Anticoagulation Instructions: N/A  *Aspirin  Instructions: N/A  *Post-op visit Date/Instructions:   TBD  *Diagnosis: Left Ureteral Stone  *Procedure: left  Ureteroscopy w/laser lithotripsy & stent exchange (47643)   Additional orders: N/A  -Admit type: OUTpatient  -Anesthesia: General  -VTE Prophylaxis Standing Order SCD's       Other:   -Standing Lab Orders Per Anesthesia    Lab other: UA&Urine Culture  -Standing Test orders EKG/Chest x-ray per Anesthesia       Test other:   - Medications:  Ancef  2gm IV  -Other orders:  N/A

## 2024-11-08 ENCOUNTER — Telehealth: Payer: Self-pay

## 2024-11-08 NOTE — Progress Notes (Signed)
    [facility] Posting Form  Surgery Date: Date: 11/26/2024  Surgeon: Dr. Redell Burnet, MD  Inpt ( No  )   Outpt (Yes)   Obs ( No  )   Diagnosis: N20.1 Left Ureteral Stone  -CPT: [phone]  Surgery: Left Ureteroscopy with Laser Lithotripsy and Stent Exchange  Stop Anticoagulations: No, may continue all  Cardiac/Medical/Pulmonary Clearance needed: no  *Orders entered into EPIC  Date: 11/08/24   *Case booked in [place]  Date: 11/08/24  *Notified pt of Surgery: Date: 11/08/24  PRE-OP UA & CX: yes, will obtain in clinic on 11/16/2024  *Placed into Prior Authorization Work [place] Date: 11/08/24  Assistant/laser/rep:No

## 2024-11-08 NOTE — Telephone Encounter (Signed)
 Per Dr. Francisca, Patient is to be scheduled for Left Ureteroscopy with Laser Lithotripsy and Stent Exchange   Michele Todd was contacted and possible surgical dates were discussed, Monday December 22nd, 2025 was agreed upon for surgery.   Patient was instructed that Dr. Francisca will require them to provide a pre-op UA & CX prior to surgery. This was ordered and scheduled drop off appointment was made for 11/16/2024.    Patient was directed to call [phone] between 1-3pm the day before surgery to find out surgical arrival time.  Instructions were given not to eat or drink from midnight on the night before surgery and have a driver for the day of surgery. On the surgery day patient was instructed to enter through the Medical Mall entrance of [facility] report the Same Day Surgery desk.   Pre-Admit Testing will be in contact via phone to set up an interview with the anesthesia team to review your history and medications prior to surgery.   Reminder of this information was sent via MyChart to the patient.

## 2024-11-15 ENCOUNTER — Other Ambulatory Visit: Payer: Self-pay

## 2024-11-15 ENCOUNTER — Encounter
Admission: RE | Admit: 2024-11-15 | Discharge: 2024-11-15 | Disposition: A | Discharge status: Home / Self Care | Source: Ambulatory Visit | Attending: Urology | Admitting: Urology

## 2024-11-15 ENCOUNTER — Encounter: Payer: Self-pay | Admitting: Urology

## 2024-11-15 DIAGNOSIS — Z01812 Encounter for preprocedural laboratory examination: Secondary | ICD-10-CM

## 2024-11-15 DIAGNOSIS — E782 Mixed hyperlipidemia: Secondary | ICD-10-CM

## 2024-11-15 NOTE — Patient Instructions (Addendum)
 Your procedure is scheduled on: 11/26/2024 Report to the Registration Desk on the 1st floor of the Medical Mall. To find out your arrival time, please call [phone] between 1PM - 3PM on: 11/23/2024  If your arrival time is 6:00 am, do not arrive before that time as the Medical Mall entrance doors do not open until 6:00 am.  REMEMBER: Instructions that are not followed completely may result in serious medical risk, up to and including death; or upon the discretion of your surgeon and anesthesiologist your surgery may need to be rescheduled.  Do not eat food or drink anything  after midnight the night before surgery.  No gum chewing or hard candies.   One week prior to surgery: Stop Anti-inflammatories (NSAIDS) such as Advil , Aleve, Ibuprofen , Motrin , Naproxen, Naprosyn and Aspirin  based products such as Excedrin, Goody's Powder, BC Powder. Stop ANY OVER THE COUNTER supplements until after surgery.  You may however, continue to take Tylenol  if needed for pain up until the day of surgery.   Continue taking all of your other prescription medications up until the day of surgery.  ON THE DAY OF SURGERY ONLY TAKE THESE MEDICATIONS WITH SIPS OF WATER:  1.mirabegron  ER (MYRBETRIQ )  2. tamsulosin  (FLOMAX )    No Alcohol for 24 hours before or after surgery.  No Smoking including e-cigarettes for 24 hours before surgery.  No chewable tobacco products for at least 6 hours before surgery.  No nicotine patches on the day of surgery.  Do not use any recreational drugs for at least a week (preferably 2 weeks) before your surgery.  Please be advised that the combination of cocaine and anesthesia may have negative outcomes, up to and including death. If you test positive for cocaine, your surgery will be cancelled.  On the morning of surgery brush your teeth with toothpaste and water, you may rinse your mouth with mouthwash if you wish. Do not swallow any toothpaste or mouthwash.  You  may shower on day of surgery.  Do not wear jewelry, make-up, hairpins, clips or nail polish.  For welded (permanent) jewelry: bracelets, anklets, waist bands, etc.  Please have this removed prior to surgery.  If it is not removed, there is a chance that hospital personnel will need to cut it off on the day of surgery.  Do not wear lotions, powders, or perfumes.   Do not shave body hair from the neck down 48 hours before surgery.  Contact lenses, hearing aids and dentures may not be worn into surgery.  Do not bring valuables to the hospital. [facility] is not responsible for any missing/lost belongings or valuables.   Notify your doctor if there is any change in your medical condition (cold, fever, infection).  Wear comfortable clothing (specific to your surgery type) to the hospital.  After surgery, you can help prevent lung complications by doing breathing exercises.  Take deep breaths and cough every 1-2 hours. Your doctor may order a device called an Incentive Spirometer to help you take deep breaths.  If you are being discharged the day of surgery, you will not be allowed to drive home. You will need a responsible individual to drive you home and stay with you for 24 hours after surgery.   Please call the Pre-admissions Testing Dept. at [phone] if you have any questions about these instructions.  Surgery Visitation Policy:  Patients having surgery or a procedure may have two visitors.  Children under the age of 72 must have an adult  with them who is not the patient.   Merchandiser, Retail to address health-related social needs:  [email or web address]

## 2024-11-16 ENCOUNTER — Other Ambulatory Visit

## 2024-11-16 DIAGNOSIS — N201 Calculus of ureter: Secondary | ICD-10-CM

## 2024-11-16 LAB — URINALYSIS, COMPLETE
Bilirubin, UA: NEGATIVE
Glucose, UA: NEGATIVE
Nitrite, UA: POSITIVE — AB
Specific Gravity, UA: 1.025 (ref 1.005–1.030)
Urobilinogen, Ur: 0.2 mg/dL (ref 0.2–1.0)
pH, UA: 6 (ref 5.0–7.5)

## 2024-11-16 LAB — MICROSCOPIC EXAMINATION
RBC, Urine: >30 /HPF — AB (ref 0–2)
WBC, UA: >30 /HPF — AB (ref 0–5)

## 2024-11-21 ENCOUNTER — Inpatient Hospital Stay: Admission: RE | Admit: 2024-11-21 | Discharge: 2024-11-21 | Attending: Urology

## 2024-11-21 ENCOUNTER — Ambulatory Visit: Payer: Self-pay | Admitting: Urology

## 2024-11-21 DIAGNOSIS — E782 Mixed hyperlipidemia: Secondary | ICD-10-CM | POA: Insufficient documentation

## 2024-11-21 DIAGNOSIS — Z01810 Encounter for preprocedural cardiovascular examination: Secondary | ICD-10-CM | POA: Diagnosis present

## 2024-11-21 DIAGNOSIS — Z01812 Encounter for preprocedural laboratory examination: Secondary | ICD-10-CM

## 2024-11-21 MED ORDER — CEPHALEXIN 500 MG PO CAPS: 500.0000 mg | ORAL_CAPSULE | Freq: Two times a day (BID) | ORAL | 0 refills | Status: AC

## 2024-11-23 LAB — CULTURE, URINE COMPREHENSIVE

## 2024-11-24 MED ORDER — BUPIVACAINE-EPINEPHRINE (PF) 0.25% -1:200000 IJ SOLN
INTRAMUSCULAR | Status: AC
  Filled 2024-11-24: qty 60

## 2024-11-25 MED ORDER — LACTATED RINGERS IV SOLN: INTRAVENOUS | Status: DC

## 2024-11-25 MED ORDER — CEFAZOLIN SODIUM-DEXTROSE 2-4 GM/100ML-% IV SOLN
2.0000 g | INTRAVENOUS | Status: AC
  Administered 2024-11-26: 2 g via INTRAVENOUS

## 2024-11-25 MED ORDER — CHLORHEXIDINE GLUCONATE 0.12 % MT SOLN
15.0000 mL | Freq: Once | OROMUCOSAL | Status: AC
  Administered 2024-11-26: 15 mL via OROMUCOSAL

## 2024-11-25 MED ORDER — ORAL CARE MOUTH RINSE: 15.0000 mL | Freq: Once | OROMUCOSAL | Status: AC

## 2024-11-26 ENCOUNTER — Other Ambulatory Visit: Payer: Self-pay

## 2024-11-26 ENCOUNTER — Ambulatory Visit: Admitting: Certified Registered"

## 2024-11-26 ENCOUNTER — Ambulatory Visit

## 2024-11-26 ENCOUNTER — Ambulatory Visit
Admission: RE | Admit: 2024-11-26 | Discharge: 2024-11-26 | Disposition: A | Discharge status: Home / Self Care | Source: Ambulatory Visit | Attending: Urology | Admitting: Urology

## 2024-11-26 ENCOUNTER — Encounter: Admission: RE | Disposition: A | Payer: Self-pay | Attending: Urology

## 2024-11-26 ENCOUNTER — Encounter: Payer: Self-pay | Admitting: Urology

## 2024-11-26 DIAGNOSIS — Z87891 Personal history of nicotine dependence: Secondary | ICD-10-CM | POA: Diagnosis not present

## 2024-11-26 DIAGNOSIS — N201 Calculus of ureter: Secondary | ICD-10-CM | POA: Insufficient documentation

## 2024-11-26 HISTORY — PX: CYSTOSCOPY/URETEROSCOPY/HOLMIUM LASER/STENT PLACEMENT: SHX6546

## 2024-11-26 SURGERY — CYSTOSCOPY/URETEROSCOPY/HOLMIUM LASER/STENT PLACEMENT
Anesthesia: General | Laterality: Left

## 2024-11-26 MED ORDER — ONDANSETRON HCL 4 MG/2ML IJ SOLN
INTRAMUSCULAR | Status: DC | PRN
  Administered 2024-11-26 (×2): 4 mg via INTRAVENOUS

## 2024-11-26 MED ORDER — ACETAMINOPHEN 10 MG/ML IV SOLN
INTRAVENOUS | Status: AC
  Filled 2024-11-26: qty 100

## 2024-11-26 MED ORDER — CHLORHEXIDINE GLUCONATE 0.12 % MT SOLN
OROMUCOSAL | Status: AC
  Filled 2024-11-26: qty 15

## 2024-11-26 MED ORDER — ACETAMINOPHEN 10 MG/ML IV SOLN
INTRAVENOUS | Status: DC | PRN
  Administered 2024-11-26: 1000 mg via INTRAVENOUS

## 2024-11-26 MED ORDER — TAMSULOSIN HCL 0.4 MG PO CAPS
0.4000 mg | ORAL_CAPSULE | Freq: Every day | ORAL | 1 refills | Status: AC
  Filled 2024-11-26: qty 30, 30d supply, fill #0
  Filled 2024-12-29 (×2): qty 30, 30d supply, fill #1

## 2024-11-26 MED ORDER — ONDANSETRON HCL 4 MG/2ML IJ SOLN
INTRAMUSCULAR | Status: AC
  Filled 2024-11-26: qty 20

## 2024-11-26 MED ORDER — ONDANSETRON HCL 4 MG/2ML IJ SOLN
INTRAMUSCULAR | Status: AC
  Filled 2024-11-26: qty 8

## 2024-11-26 MED ORDER — PROPOFOL 10 MG/ML IV BOLUS
INTRAVENOUS | Status: DC | PRN
  Administered 2024-11-26: 150 mg via INTRAVENOUS

## 2024-11-26 MED ORDER — LIDOCAINE HCL (PF) 2 % IJ SOLN
INTRAMUSCULAR | Status: AC
  Filled 2024-11-26: qty 30

## 2024-11-26 MED ORDER — FENTANYL CITRATE (PF) 100 MCG/2ML IJ SOLN
INTRAMUSCULAR | Status: DC | PRN
  Administered 2024-11-26 (×2): 50 ug via INTRAVENOUS

## 2024-11-26 MED ORDER — DEXAMETHASONE SOD PHOSPHATE PF 10 MG/ML IJ SOLN
INTRAMUSCULAR | Status: DC | PRN
  Administered 2024-11-26: 10 mg via INTRAVENOUS

## 2024-11-26 MED ORDER — ACETAMINOPHEN 10 MG/ML IV SOLN
INTRAVENOUS | Status: AC
  Filled 2024-11-26: qty 200

## 2024-11-26 MED ORDER — DEXMEDETOMIDINE HCL IN NACL 80 MCG/20ML IV SOLN
INTRAVENOUS | Status: AC
  Filled 2024-11-26: qty 20

## 2024-11-26 MED ORDER — ROCURONIUM BROMIDE 100 MG/10ML IV SOLN
INTRAVENOUS | Status: DC | PRN
  Administered 2024-11-26: 10 mg via INTRAVENOUS
  Administered 2024-11-26: 40 mg via INTRAVENOUS

## 2024-11-26 MED ORDER — ROCURONIUM BROMIDE 10 MG/ML (PF) SYRINGE
PREFILLED_SYRINGE | INTRAVENOUS | Status: AC
  Filled 2024-11-26: qty 40

## 2024-11-26 MED ORDER — IOHEXOL 180 MG/ML  SOLN
INTRAMUSCULAR | Status: DC | PRN
  Administered 2024-11-26: 10 mL

## 2024-11-26 MED ORDER — FENTANYL CITRATE (PF) 100 MCG/2ML IJ SOLN
INTRAMUSCULAR | Status: AC
  Filled 2024-11-26: qty 2

## 2024-11-26 MED ORDER — ROCURONIUM BROMIDE 10 MG/ML (PF) SYRINGE
PREFILLED_SYRINGE | INTRAVENOUS | Status: AC
  Filled 2024-11-26: qty 20

## 2024-11-26 MED ORDER — SUGAMMADEX SODIUM 200 MG/2ML IV SOLN
INTRAVENOUS | Status: DC | PRN
  Administered 2024-11-26: 200 mg via INTRAVENOUS

## 2024-11-26 MED ORDER — SUCCINYLCHOLINE CHLORIDE 200 MG/10ML IV SOSY
PREFILLED_SYRINGE | INTRAVENOUS | Status: DC | PRN
  Administered 2024-11-26: 100 mg via INTRAVENOUS

## 2024-11-26 MED ORDER — CEFAZOLIN SODIUM-DEXTROSE 2-4 GM/100ML-% IV SOLN
INTRAVENOUS | Status: AC
  Filled 2024-11-26: qty 100

## 2024-11-26 MED ORDER — GLYCOPYRROLATE 0.2 MG/ML IJ SOLN
INTRAMUSCULAR | Status: AC
  Filled 2024-11-26: qty 7

## 2024-11-26 MED ORDER — SUCCINYLCHOLINE CHLORIDE 200 MG/10ML IV SOSY
PREFILLED_SYRINGE | INTRAVENOUS | Status: AC
  Filled 2024-11-26: qty 10

## 2024-11-26 MED ORDER — GLYCOPYRROLATE 0.2 MG/ML IJ SOLN
INTRAMUSCULAR | Status: DC | PRN
  Administered 2024-11-26: .2 mg via INTRAVENOUS

## 2024-11-26 MED ORDER — TRAMADOL HCL 50 MG PO TABS
25.0000 mg | ORAL_TABLET | Freq: Four times a day (QID) | ORAL | 0 refills | Status: AC | PRN
  Filled 2024-11-26: qty 8, 2d supply, fill #0

## 2024-11-26 MED ORDER — SUCCINYLCHOLINE CHLORIDE 200 MG/10ML IV SOSY
PREFILLED_SYRINGE | INTRAVENOUS | Status: AC
  Filled 2024-11-26: qty 40

## 2024-11-26 MED ORDER — LIDOCAINE HCL (CARDIAC) PF 100 MG/5ML IV SOSY
PREFILLED_SYRINGE | INTRAVENOUS | Status: DC | PRN
  Administered 2024-11-26: 100 mg via INTRAVENOUS

## 2024-11-26 MED ORDER — SODIUM CHLORIDE 0.9 % IR SOLN
Status: DC | PRN
  Administered 2024-11-26: 1

## 2024-11-26 MED FILL — Nitrofurantoin Monohydrate Macrocrystalline Cap 100 MG: 100.0000 mg | ORAL | 1 days supply | Qty: 1 | Fill #0 | Status: AC

## 2024-11-26 SURGICAL SUPPLY — 18 items
ADHESIVE MASTISOL STRL (MISCELLANEOUS) IMPLANT
BAG DRAIN SIEMENS DORNER NS (MISCELLANEOUS) ×1 IMPLANT
BRUSH SCRUB EZ 4% CHG (MISCELLANEOUS) ×1 IMPLANT
DRAPE UTILITY 15X26 TOWEL STRL (DRAPES) ×1 IMPLANT
DRSG TEGADERM 2-3/8X2-3/4 SM (GAUZE/BANDAGES/DRESSINGS) IMPLANT
FIBER LASER MOSES 365 DFL (Laser) IMPLANT
GLOVE BIOGEL PI IND STRL 7.5 (GLOVE) ×2 IMPLANT
GOWN STRL REUS W/ TWL LRG LVL3 (GOWN DISPOSABLE) ×1 IMPLANT
GOWN STRL REUS W/ TWL XL LVL3 (GOWN DISPOSABLE) ×1 IMPLANT
GUIDEWIRE STR DUAL SENSOR (WIRE) ×1 IMPLANT
KIT TURNOVER CYSTO (KITS) ×1 IMPLANT
PACK CYSTO AR (MISCELLANEOUS) ×1 IMPLANT
SOL .9 NS 3000ML IRR UROMATIC (IV SOLUTION) ×1 IMPLANT
SOLN STERILE WATER 500 ML (IV SOLUTION) ×1 IMPLANT
STENT URET 6FRX26 CONTOUR (STENTS) IMPLANT
SURGILUBE 2OZ TUBE FLIPTOP (MISCELLANEOUS) ×1 IMPLANT
SYR 10ML LL (SYRINGE) ×1 IMPLANT
VALVE UROSEAL ADJ ENDO (VALVE) IMPLANT

## 2024-11-26 NOTE — Anesthesia Postprocedure Evaluation (Signed)
"   Anesthesia Post Note  Patient: Michele Todd  Procedure(s) Performed: CYSTOSCOPY/URETEROSCOPY/HOLMIUM LASER/STENT PLACEMENT (Left)  Patient location during evaluation: PACU Anesthesia Type: General Level of consciousness: awake Pain management: satisfactory to patient Vital Signs Assessment: post-procedure vital signs reviewed and stable Respiratory status: spontaneous breathing Cardiovascular status: stable Anesthetic complications: no   No notable events documented.   Last Vitals:  Vitals:   11/26/24 1415 11/26/24 1428  BP: 129/61 125/69  Pulse: 89 76  Resp: 17 13  Temp:  36.7 C  SpO2: 92% 96%    Last Pain:  Vitals:   11/26/24 1428  TempSrc:   PainSc: 0-No pain                 [name],Michele Todd      "

## 2024-11-26 NOTE — Op Note (Signed)
 Date of procedure: 11/26/2024  Preoperative diagnosis:  Left proximal ureteral stone  Postoperative diagnosis:  Same  Procedure: Cystoscopy, left ureteroscopy, laser lithotripsy, left retrograde pyelogram with intraoperative interpretation, left ureteral stent placement  Surgeon: Redell Burnet, MD  Anesthesia: General  Complications: None  Intraoperative findings:  Partially duplicated left collecting system, ureter splits proximally Left ureteral stone had been pushed back into the lower pole moiety, fragmented to dust Uncomplicated stent placement in lower pole moiety  EBL: Minimal  Specimens: None  Drains: Left 6 French by 26 cm ureteral stent  Indication: Michele Todd is a 73 y.o. patient with 6 mm left proximal ureteral stone and infection previously stented, here today for definitive management with ureteroscopy.  After reviewing the management options for treatment, they elected to proceed with the above surgical procedure(s). We have discussed the potential benefits and risks of the procedure, side effects of the proposed treatment, the likelihood of the patient achieving the goals of the procedure, and any potential problems that might occur during the procedure or recuperation. Informed consent has been obtained.  Description of procedure:  The patient was taken to the operating room and general anesthesia was induced. SCDs were placed for DVT prophylaxis. The patient was placed in the dorsal lithotomy position, prepped and draped in the usual sterile fashion, and preoperative antibiotics were administered. A preoperative time-out was performed.   A 21 French rigid cystoscope was used to intubate the urethra and thorough cystoscopy was performed.  The bladder was grossly normal.  There were 2 stents emanating from the left ureteral orifice. One of the stents was grasped and removed.  The second stent was grasped and pulled to the meatus, sensor wire was advanced  through the stent into the lower pole.  A digital single-channel flexible ureteroscope was advanced over the wire up to the kidney.  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  I first explored the lower pole system and there was a 6 mm yellow stone.  A 365 m laser fiber on settings of 0.5 J and 80 Hz was used to methodically dust the stone.  Pyeloscopy showed no other stone fragments.  A retrograde pyelogram performed from the proximal ureter showed no extravasation or filling defects in the lower pole moiety.  A wire was replaced in the lower pole moiety and pullback ureteroscopy showed no other abnormalities.  A 6 French by 26 cm ureteral stent was placed fluoroscopically with a curl in the lower pole moiety, as well as in the bladder.  The bladder was drained, Mastisol and Tegaderm were used to secure the Dangler of the stent to the suprapubic area.  Disposition: Stable to PACU  Plan: Can remove stent at home on 12/28 Keep March routine yearly follow-up as scheduled with PA  Redell Burnet, MD

## 2024-11-26 NOTE — H&P (Signed)
 "  11/26/2024 1:19 PM   Michele Todd 02/03/1951 [id number]  CC: Left ureteral stone  HPI: Previously presented with left ureteral stone and sepsis from urinary source, underwent stent placement with Dr. Carolee.  Two stents were placed on the left side in the setting of a duplicated system.  She has tolerated the stent well.  Here today for definitive management with ureteroscopy.   PMH: Past Medical History:  Diagnosis Date   Headache    sinus   History of kidney stones    Left ureteral stone    Dec. 2025   Neuromuscular disorder (HCC)    Numbness and tingling of right lower extremity    R/T pinched nerve   Urinary tract infection    Vertigo    x1, over 15 yrs ago    Surgical History: Past Surgical History:  Procedure Laterality Date   ABDOMINAL HYSTERECTOMY     ANTERIOR AND POSTERIOR REPAIR N/A 10/26/2021   Procedure: ANTERIOR (CYSTOCELE) AND POSTERIOR REPAIR (RECTOCELE);  Surgeon: Schermerhorn, Debby PARAS, MD;  Location: [facility] OR[facility];  Service: Gynecology;  Laterality: N/A;   CATARACT EXTRACTION, BILATERAL     CHOLECYSTECTOMY     CYSTOSCOPY W/ URETERAL STENT PLACEMENT Left 11/04/2024   Procedure: CYSTOSCOPY, WITH RETROGRADE PYELOGRAM AND URETERAL STENT INSERTION;  Surgeon: Carolee Sherwood JONETTA DOUGLAS, MD;  Location: [facility];  Service: Urology;  Laterality: Left;   KNEE ARTHROSCOPY WITH LATERAL MENISECTOMY Left 10/18/2022   Procedure: Left knee arthroscopic partial medial and lateral meniscectomy;  Surgeon: Tobie Priest, MD;  Location: [facility];  Service: Orthopedics;  Laterality: Left;   KNEE ARTHROSCOPY WITH MEDIAL MENISECTOMY Left 10/18/2022   Procedure: Left knee arthroscopic partial medial and lateral meniscectomy;  Surgeon: Tobie Priest, MD;  Location: [facility];  Service: Orthopedics;  Laterality: Left;   KNEE ARTHROSCOPY WITH MENISCAL REPAIR Right 08/23/2017   Procedure: KNEE ARTHROSCOPY PARTIAL MEDIAL AND LATERNAL MENISCECTOMY WITH POSSIBLE CHONDROPLASTY  /DEBRIDEMENT OF PATELLAFEMORAL COMPARTMENT;  Surgeon: Tobie Priest, MD;  Location: [facility];  Service: Orthopedics;  Laterality: Right;   prolapsed bladder repair N/A 10/2021   TONSILLECTOMY       Family History: Family History  Problem Relation Age of Onset   Lung cancer Mother    Breast cancer Paternal Aunt        44's   Breast cancer Paternal Aunt        In Late 34's.   Kidney disease Sister     Social History:  reports that she has quit smoking. She has never used smokeless tobacco. She reports current alcohol use. She reports that she does not use drugs.  Physical Exam: BP 132/87   Pulse 96   Temp 97.9 F (36.6 C) (Temporal)   Resp 17   Ht 5' 7 (1.702 m)   Wt 80.3 kg   SpO2 98%   BMI 27.72 kg/m    Constitutional:  Alert and oriented, No acute distress. Cardiovascular: Regular rate and rhythm Respiratory: Clear to auscultation bilaterally GI: Abdomen is soft, nontender, nondistended, no abdominal masses   Laboratory Data: Urine culture 12/12 with E. coli and Enterococcus, treated with culture appropriate antibiotics  Assessment & Plan:   73 year old female with left proximal ureteral stone, previously stented for infection and here today for definitive management.  We specifically discussed the risks ureteroscopy including bleeding, infection/sepsis, stent related symptoms including flank pain/urgency/frequency/incontinence/dysuria, ureteral injury, ureteral stricture, inability to access stone, or need for staged or additional procedures.  Left ureteroscopy, laser lithotripsy,  stent placement  \\  Redell Burnet, MD 11/26/2024  [facility] [address] [place], [place] [address] [phone]   "

## 2024-11-26 NOTE — Anesthesia Preprocedure Evaluation (Signed)
 "                                  Anesthesia Evaluation  Patient identified by MRN, date of birth, ID band Patient awake    Reviewed: Allergy & Precautions, NPO status , Patient's Chart, lab work & pertinent test results  Airway Mallampati: II  TM Distance: >3 FB Neck ROM: full    Dental  (+) Teeth Intact   Pulmonary neg pulmonary ROS, Patient abstained from smoking., former smoker   Pulmonary exam normal        Cardiovascular Exercise Tolerance: Good negative cardio ROS Normal cardiovascular exam Rhythm:Regular Rate:Normal     Neuro/Psych  Headaches negative neurological ROS  negative psych ROS   GI/Hepatic negative GI ROS, Neg liver ROS,,,  Endo/Other  negative endocrine ROS    Renal/GU   negative genitourinary   Musculoskeletal negative musculoskeletal ROS (+)    Abdominal Normal abdominal exam  (+)   Peds  Hematology negative hematology ROS (+)   Anesthesia Other Findings Past Medical History: No date: Headache     Comment:  sinus No date: History of kidney stones No date: Left ureteral stone     Comment:  Dec. 2025 No date: Neuromuscular disorder (HCC) No date: Numbness and tingling of right lower extremity     Comment:  R/T pinched nerve No date: Urinary tract infection No date: Vertigo     Comment:  x1, over 15 yrs ago  Past Surgical History: No date: ABDOMINAL HYSTERECTOMY 10/26/2021: ANTERIOR AND POSTERIOR REPAIR; N/A     Comment:  Procedure: ANTERIOR (CYSTOCELE) AND POSTERIOR REPAIR               (RECTOCELE);  Surgeon: Schermerhorn, Debby PARAS, MD;                Location: [facility];  Service: Gynecology;  Laterality:               N/A; No date: CATARACT EXTRACTION, BILATERAL No date: CHOLECYSTECTOMY 11/04/2024: CYSTOSCOPY W/ URETERAL STENT PLACEMENT; Left     Comment:  Procedure: CYSTOSCOPY, WITH RETROGRADE PYELOGRAM AND               URETERAL STENT INSERTION;  Surgeon: Carolee Sherwood JONETTA DOUGLAS,               MD;  Location: [facility]  [facility];  Service: Urology;  Laterality:               Left; 10/18/2022: KNEE ARTHROSCOPY WITH LATERAL MENISECTOMY; Left     Comment:  Procedure: Left knee arthroscopic partial medial and               lateral meniscectomy;  Surgeon: Tobie Priest, MD;                Location: [facility];  Service: Orthopedics;                Laterality: Left; 10/18/2022: KNEE ARTHROSCOPY WITH MEDIAL MENISECTOMY; Left     Comment:  Procedure: Left knee arthroscopic partial medial and               lateral meniscectomy;  Surgeon: Tobie Priest, MD;                Location: [facility];  Service: Orthopedics;                Laterality: Left; 08/23/2017: KNEE  ARTHROSCOPY WITH MENISCAL REPAIR; Right     Comment:  Procedure: KNEE ARTHROSCOPY PARTIAL MEDIAL AND LATERNAL               MENISCECTOMY WITH POSSIBLE CHONDROPLASTY /DEBRIDEMENT OF               PATELLAFEMORAL COMPARTMENT;  Surgeon: Tobie Priest, MD;                Location: [facility];  Service: Orthopedics;                Laterality: Right; 10/2021: prolapsed bladder repair; N/A No date: TONSILLECTOMY  BMI    Body Mass Index: 27.72 kg/m      Reproductive/Obstetrics negative OB ROS                              Anesthesia Physical Anesthesia Plan  ASA: 2  Anesthesia Plan: General   Post-op Pain Management:    Induction: Intravenous  PONV Risk Score and Plan: Ondansetron , Dexamethasone , Midazolam  and Treatment may vary due to age or medical condition  Airway Management Planned: Oral ETT  Additional Equipment:   Intra-op Plan:   Post-operative Plan: Extubation in OR  Informed Consent: I have reviewed the patients History and Physical, chart, labs and discussed the procedure including the risks, benefits and alternatives for the proposed anesthesia with the patient or authorized representative who has indicated his/her understanding and acceptance.     Dental Advisory Given  Plan  Discussed with: CRNA  Anesthesia Plan Comments:         Anesthesia Quick Evaluation  "

## 2024-11-26 NOTE — Anesthesia Procedure Notes (Signed)
 Procedure Name: Intubation Date/Time: 11/26/2024 1:27 PM  Performed by: Ledora Duncan, CRNAPre-anesthesia Checklist: Patient identified, Emergency Drugs available, Suction available and Patient being monitored Patient Re-evaluated:Patient Re-evaluated prior to induction Oxygen Delivery Method: Circle system utilized Preoxygenation: Pre-oxygenation with 100% oxygen Induction Type: IV induction Ventilation: Mask ventilation without difficulty Laryngoscope Size: McGrath and 3 Grade View: Grade I Tube type: Oral Tube size: 6.5 mm Number of attempts: 1 Airway Equipment and Method: Stylet Placement Confirmation: ETT inserted through vocal cords under direct vision, positive ETCO2 and breath sounds checked- equal and bilateral Secured at: 21 cm Tube secured with: Tape Dental Injury: Teeth and Oropharynx as per pre-operative assessment

## 2024-11-26 NOTE — Transfer of Care (Signed)
 Immediate Anesthesia Transfer of Care Note  Patient: Michele Todd  Procedure(s) Performed: CYSTOSCOPY/URETEROSCOPY/HOLMIUM LASER/STENT PLACEMENT (Left)  Patient Location: PACU  Anesthesia Type:General  Level of Consciousness: awake, drowsy, and patient cooperative  Airway & Oxygen Therapy: Patient Spontanous Breathing and Patient connected to face mask oxygen  Post-op Assessment: Report given to RN and Post -op Vital signs reviewed and stable  Post vital signs: Reviewed and stable  Last Vitals:  Vitals Value Taken Time  BP 128/66 11/26/24 13:53  Temp    Pulse 84 11/26/24 13:55  Resp 11 11/26/24 13:55  SpO2 95 % 11/26/24 13:55  Vitals shown include unfiled device data.  Last Pain:  Vitals:   11/26/24 1018  TempSrc: Temporal  PainSc: 0-No pain         Complications: No notable events documented.

## 2024-11-27 ENCOUNTER — Encounter: Payer: Self-pay | Admitting: Urology

## 2024-12-29 ENCOUNTER — Other Ambulatory Visit: Payer: Self-pay

## 2025-01-11 ENCOUNTER — Other Ambulatory Visit: Payer: Self-pay | Admitting: Student

## 2025-01-11 DIAGNOSIS — Z1231 Encounter for screening mammogram for malignant neoplasm of breast: Secondary | ICD-10-CM

## 2025-01-31 ENCOUNTER — Encounter

## 2025-02-13 ENCOUNTER — Ambulatory Visit: Admitting: Urology
